# Patient Record
Sex: Female | Born: 1990 | Race: Black or African American | Hispanic: No | Marital: Single | Smoking: Never smoker
Health system: Southern US, Community
[De-identification: ages and names within clinical notes are randomized; demographics above are authoritative.]

## PROBLEM LIST (undated history)

## (undated) ENCOUNTER — Inpatient Hospital Stay (HOSPITAL_COMMUNITY): Payer: Self-pay

## (undated) DIAGNOSIS — R7303 Prediabetes: Secondary | ICD-10-CM

## (undated) DIAGNOSIS — D649 Anemia, unspecified: Secondary | ICD-10-CM

## (undated) DIAGNOSIS — E538 Deficiency of other specified B group vitamins: Secondary | ICD-10-CM

## (undated) DIAGNOSIS — I10 Essential (primary) hypertension: Secondary | ICD-10-CM

## (undated) DIAGNOSIS — R569 Unspecified convulsions: Secondary | ICD-10-CM

## (undated) DIAGNOSIS — R51 Headache: Secondary | ICD-10-CM

## (undated) HISTORY — PX: COSMETIC SURGERY: SHX468

## (undated) HISTORY — PX: WISDOM TOOTH EXTRACTION: SHX21

## (undated) HISTORY — PX: NO PAST SURGERIES: SHX2092

## (undated) HISTORY — DX: Deficiency of other specified B group vitamins: E53.8

## (undated) HISTORY — PX: MOUTH SURGERY: SHX715

## (undated) HISTORY — DX: Anemia, unspecified: D64.9

## (undated) HISTORY — DX: Essential (primary) hypertension: I10

## (undated) HISTORY — DX: Unspecified convulsions: R56.9

## (undated) HISTORY — DX: Prediabetes: R73.03

---
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
## 2010-05-29 ENCOUNTER — Encounter: Admission: RE | Admit: 2010-05-29 | Discharge: 2010-05-29 | Payer: Self-pay | Admitting: Family Medicine

## 2011-03-20 LAB — WET PREP, GENITAL
Clue Cells Wet Prep HPF POC: NONE SEEN
Trich, Wet Prep: NONE SEEN
Yeast Wet Prep HPF POC: NONE SEEN

## 2011-03-20 LAB — URINALYSIS, ROUTINE W REFLEX MICROSCOPIC
Leukocytes, UA: NEGATIVE
Protein, ur: NEGATIVE mg/dL
Specific Gravity, Urine: >1.03 — ABNORMAL HIGH (ref 1.005–1.030)
Urobilinogen, UA: 0.2 mg/dL (ref 0.0–1.0)

## 2011-03-20 LAB — CBC
HCT: 37 % (ref 36.0–46.0)
Hemoglobin: 12.4 g/dL (ref 12.0–15.0)
MCHC: 33.5 g/dL (ref 30.0–36.0)
MCV: 85.3 fL (ref 78.0–100.0)
RBC: 4.34 MIL/uL (ref 3.87–5.11)
RDW: 13.3 % (ref 11.5–15.5)
WBC: 6.4 10*3/uL (ref 4.0–10.5)

## 2011-03-20 LAB — GC/CHLAMYDIA PROBE AMP, GENITAL: Chlamydia, DNA Probe: NEGATIVE

## 2011-03-20 LAB — URINE MICROSCOPIC-ADD ON

## 2011-03-20 LAB — POCT PREGNANCY, URINE: Preg Test, Ur: POSITIVE

## 2011-03-20 LAB — HCG, QUANTITATIVE, PREGNANCY: hCG, Beta Chain, Quant, S: <2 m[IU]/mL (ref <5)

## 2011-08-15 ENCOUNTER — Ambulatory Visit (INDEPENDENT_AMBULATORY_CARE_PROVIDER_SITE_OTHER): Payer: BC Managed Care – PPO

## 2011-08-15 ENCOUNTER — Inpatient Hospital Stay (INDEPENDENT_AMBULATORY_CARE_PROVIDER_SITE_OTHER)
Admission: RE | Admit: 2011-08-15 | Discharge: 2011-08-15 | Disposition: A | Discharge status: Home / Self Care | Payer: BC Managed Care – PPO | Source: Ambulatory Visit | Attending: Emergency Medicine | Admitting: Emergency Medicine

## 2011-08-15 DIAGNOSIS — S61209A Unspecified open wound of unspecified finger without damage to nail, initial encounter: Secondary | ICD-10-CM

## 2011-12-09 NOTE — L&D Delivery Note (Signed)
Delivery Note At 9:32 AM a viable female was delivered via Vaginal, Spontaneous Delivery (Presentation: Left Occiput Anterior).  Loose nuchal cord x 1.  APGAR: 8, 9;  .   Placenta status: Intact, Spontaneous.  Cord: 3 vessels with the following complications: None.    Anesthesia: Epidural Local  Episiotomy: None Lacerations: 2nd degree Suture Repair: 2.0 vicryl rapide Est. Blood Loss (mL):  150 ml  Mom to postpartum.  Baby to nursery-stable.  JACKSON-Pierpoint,Joh Rao A 09/12/2012, 9:59 AM

## 2012-01-28 ENCOUNTER — Encounter (HOSPITAL_COMMUNITY): Payer: Self-pay | Admitting: *Deleted

## 2012-01-28 ENCOUNTER — Inpatient Hospital Stay (HOSPITAL_COMMUNITY)
Admission: AD | Admit: 2012-01-28 | Discharge: 2012-01-28 | Disposition: A | Discharge status: Home Health | Payer: BC Managed Care – PPO | Attending: Obstetrics & Gynecology | Admitting: Obstetrics & Gynecology

## 2012-01-28 ENCOUNTER — Inpatient Hospital Stay (HOSPITAL_COMMUNITY): Payer: BC Managed Care – PPO

## 2012-01-28 DIAGNOSIS — O9989 Other specified diseases and conditions complicating pregnancy, childbirth and the puerperium: Secondary | ICD-10-CM

## 2012-01-28 DIAGNOSIS — O26899 Other specified pregnancy related conditions, unspecified trimester: Secondary | ICD-10-CM

## 2012-01-28 DIAGNOSIS — O99891 Other specified diseases and conditions complicating pregnancy: Secondary | ICD-10-CM | POA: Insufficient documentation

## 2012-01-28 DIAGNOSIS — R109 Unspecified abdominal pain: Secondary | ICD-10-CM | POA: Insufficient documentation

## 2012-01-28 LAB — URINALYSIS, ROUTINE W REFLEX MICROSCOPIC
Bilirubin Urine: NEGATIVE
Glucose, UA: NEGATIVE mg/dL
Leukocytes, UA: NEGATIVE
Nitrite: NEGATIVE
Protein, ur: NEGATIVE mg/dL

## 2012-01-28 LAB — CBC
HCT: 34.4 % — ABNORMAL LOW (ref 36.0–46.0)
MCHC: 33.4 g/dL (ref 30.0–36.0)
RDW: 13.2 % (ref 11.5–15.5)
WBC: 8.6 10*3/uL (ref 4.0–10.5)

## 2012-01-28 NOTE — ED Provider Notes (Signed)
History     Chief Complaint  Patient presents with  . Dysmenorrhea   HPI 21 y.o. G2P0010 at 4.[redacted] weeks EGA, cramping x 1 week, worse tonight, shooting vaginal pain q 30 minutes. No bleeding or discharge. Taking prometrium d/t low progesterone, seeing fertility doctor in [place], conceived via IUI.    Past Medical History  Diagnosis Date  . No pertinent past medical history     Past Surgical History  Procedure Date  . Wisdom tooth extraction     Family History  Problem Relation Age of Onset  . Anesthesia problems Neg Hx     History  Substance Use Topics  . Smoking status: Never Smoker   . Smokeless tobacco: Not on file  . Alcohol Use: No    Allergies: No Known Allergies  Prescriptions prior to admission  Medication Sig Dispense Refill  . acetaminophen (TYLENOL) 500 MG tablet Take 500 mg by mouth every 6 (six) hours as needed. For pain      . Prenatal Vit-Fe Fumarate-FA (PRENATAL MULTIVITAMIN) TABS Take 1 tablet by mouth daily.      . progesterone (PROMETRIUM) 200 MG capsule Take 200 mg by mouth daily.        Review of Systems  Constitutional: Negative.   Respiratory: Negative.   Cardiovascular: Negative.   Gastrointestinal: Positive for abdominal pain. Negative for nausea, vomiting, diarrhea and constipation.  Genitourinary: Negative for dysuria, urgency, frequency, hematuria and flank pain.       Negative for vaginal bleeding  Musculoskeletal: Negative.   Neurological: Negative.   Psychiatric/Behavioral: Negative.    Physical Exam   Blood pressure 132/54, pulse 92, temperature 99.3 F (37.4 C), resp. rate 18, height 5\' 2"  (1.575 m), weight 179 lb (81.194 kg), last menstrual period 12/25/2011, SpO2 100.00%.  Physical Exam  MAU Course  Procedures  Results for orders placed during the hospital encounter of 01/28/12 (from the past 24 hour(s))  URINALYSIS, ROUTINE W REFLEX MICROSCOPIC     Status: Abnormal   Collection Time   01/28/12 12:58 AM   Component Value Range   Color, Urine YELLOW  YELLOW    APPearance CLEAR  CLEAR    Specific Gravity, Urine >1.030 (*) 1.005 - 1.030    pH 6.0  5.0 - 8.0    Glucose, UA NEGATIVE  NEGATIVE (mg/dL)   Hgb urine dipstick NEGATIVE  NEGATIVE    Bilirubin Urine NEGATIVE  NEGATIVE    Ketones, ur NEGATIVE  NEGATIVE (mg/dL)   Protein, ur NEGATIVE  NEGATIVE (mg/dL)   Urobilinogen, UA 0.2  0.0 - 1.0 (mg/dL)   Nitrite NEGATIVE  NEGATIVE    Leukocytes, UA NEGATIVE  NEGATIVE   POCT PREGNANCY, URINE     Status: Abnormal   Collection Time   01/28/12  1:14 AM      Component Value Range   Preg Test, Ur POSITIVE (*) NEGATIVE   CBC     Status: Abnormal   Collection Time   01/28/12  2:20 AM      Component Value Range   WBC 8.6  4.0 - 10.5 (K/uL)   RBC 4.11  3.87 - 5.11 (MIL/uL)   Hemoglobin 11.5 (*) 12.0 - 15.0 (g/dL)   HCT 45.4 (*) 09.8 - 46.0 (%)   MCV 83.7  78.0 - 100.0 (fL)   MCH 28.0  26.0 - 34.0 (pg)   MCHC 33.4  30.0 - 36.0 (g/dL)   RDW 11.9  14.7 - 82.9 (%)   Platelets 224  150 - 400 (  K/uL)   US Ob Comp Less 14 Wks  01/28/2012  *RADIOLOGY REPORT*  Clinical Data: Pelvic pain for 1 week.  OBSTETRIC <14 WK [place] AND TRANSVAGINAL OB US  Technique:  Both transabdominal and transvaginal ultrasound examinations were performed for complete evaluation of the gestation as well as the maternal uterus, adnexal regions, and pelvic cul-de-sac.  Transvaginal technique was performed to assess early pregnancy.  Comparison:  Pelvic ultrasound performed 02/09/2009  Intrauterine gestational sac:  Question of tiny gestational sac, too small to fully define. Yolk sac: No Embryo: No Cardiac Activity: N/A  MSD: 2.0  mm  4    w 4    d         [place] EDC: 10/02/2012  Maternal uterus/adnexae: The uterus is otherwise unremarkable in appearance.  There is no evidence of subchorionic hemorrhage.  The ovaries are within normal limits.  The right ovary measures 2.9 x 1.6 x 1.8 cm, while the left ovary measures 4.1 x 2.5 x 2.1 cm. There is  no evidence for ovarian torsion; no suspicious adnexal masses are seen.  There is no evidence for ectopic pregnancy.  No free fluid is seen within the pelvic cul-de-sac.  IMPRESSION: Suspect tiny intrauterine gestational sac, measuring 2 mm, corresponding to a gestational age of [redacted] weeks 4 days.  This matches the gestational age of [redacted] weeks 6 days by LMP, reflecting an estimated date of delivery of September 30, 2012.  If the patient's symptoms persist, follow-up pelvic ultrasound could be considered in 1 to 2 weeks, for further evaluation.  Original Report Authenticated By: Tonia Ghent, M.D.   US Ob Transvaginal  01/28/2012  *RADIOLOGY REPORT*  Clinical Data: Pelvic pain for 1 week.  OBSTETRIC <14 WK [place] AND TRANSVAGINAL OB US  Technique:  Both transabdominal and transvaginal ultrasound examinations were performed for complete evaluation of the gestation as well as the maternal uterus, adnexal regions, and pelvic cul-de-sac.  Transvaginal technique was performed to assess early pregnancy.  Comparison:  Pelvic ultrasound performed 02/09/2009  Intrauterine gestational sac:  Question of tiny gestational sac, too small to fully define. Yolk sac: No Embryo: No Cardiac Activity: N/A  MSD: 2.0  mm  4    w 4    d         [place] EDC: 10/02/2012  Maternal uterus/adnexae: The uterus is otherwise unremarkable in appearance.  There is no evidence of subchorionic hemorrhage.  The ovaries are within normal limits.  The right ovary measures 2.9 x 1.6 x 1.8 cm, while the left ovary measures 4.1 x 2.5 x 2.1 cm. There is no evidence for ovarian torsion; no suspicious adnexal masses are seen.  There is no evidence for ectopic pregnancy.  No free fluid is seen within the pelvic cul-de-sac.  IMPRESSION: Suspect tiny intrauterine gestational sac, measuring 2 mm, corresponding to a gestational age of [redacted] weeks 4 days.  This matches the gestational age of [redacted] weeks 6 days by LMP, reflecting an estimated date of delivery of September 30, 2012.  If  the patient's symptoms persist, follow-up pelvic ultrasound could be considered in 1 to 2 weeks, for further evaluation.  Original Report Authenticated By: Tonia Ghent, M.D.    Assessment and Plan  21 y.o. G2P0010 at 4.[redacted] weeks EGA Low abd pain F/U 48 hours for repeat quant Precautions rev'd  Jadae Steinke 01/28/2012, 2:21 AM

## 2012-01-28 NOTE — Progress Notes (Signed)
Pt reports "for a week i have been having some serious pains", LMP 12/25/2011, cramping is worsening tonight, denies bleeding. Denies dysuria. Positive home preg test 01/19/2012

## 2012-03-30 ENCOUNTER — Other Ambulatory Visit: Payer: Self-pay | Admitting: Obstetrics

## 2012-03-30 ENCOUNTER — Encounter (HOSPITAL_COMMUNITY): Payer: Self-pay

## 2012-03-30 ENCOUNTER — Inpatient Hospital Stay (HOSPITAL_COMMUNITY)
Admission: AD | Admit: 2012-03-30 | Discharge: 2012-03-30 | Disposition: A | Discharge status: Home / Self Care | Payer: BC Managed Care – PPO | Source: Ambulatory Visit | Attending: Obstetrics | Admitting: Obstetrics

## 2012-03-30 DIAGNOSIS — O219 Vomiting of pregnancy, unspecified: Secondary | ICD-10-CM

## 2012-03-30 DIAGNOSIS — O210 Mild hyperemesis gravidarum: Secondary | ICD-10-CM | POA: Insufficient documentation

## 2012-03-30 DIAGNOSIS — K5900 Constipation, unspecified: Secondary | ICD-10-CM | POA: Insufficient documentation

## 2012-03-30 HISTORY — DX: Headache: R51

## 2012-03-30 MED ORDER — SODIUM CHLORIDE 0.9 % IV SOLN
25.0000 mg | Freq: Once | INTRAVENOUS | Status: DC
  Filled 2012-03-30: qty 1

## 2012-03-30 MED ORDER — LACTATED RINGERS IV SOLN
25.0000 mg | Freq: Once | INTRAVENOUS | Status: AC
  Administered 2012-03-30: 25 mg via INTRAVENOUS
  Filled 2012-03-30: qty 1

## 2012-03-30 MED ORDER — PROMETHAZINE HCL 25 MG PO TABS: 25.0000 mg | ORAL_TABLET | Freq: Four times a day (QID) | ORAL | Status: DC | PRN

## 2012-03-30 MED ORDER — LACTATED RINGERS IV SOLN: 25.0000 mg | Freq: Once | INTRAVENOUS | Status: DC

## 2012-03-30 MED ORDER — BISACODYL 10 MG RE SUPP
10.0000 mg | Freq: Once | RECTAL | Status: AC
  Administered 2012-03-30: 10 mg via RECTAL
  Filled 2012-03-30: qty 1

## 2012-03-30 NOTE — MAU Note (Signed)
Patient states she has been having nausea and vomiting for about 3 weeks, has gotten worse over the past 2 weeks. Feels achy all over for the past few days. Was seen in the office today and sent to MAU for IV fluids.

## 2012-03-30 NOTE — MAU Provider Note (Signed)
  History     CSN: [id number]  Arrival date and time: 03/30/12 4098   First Provider Initiated Contact with Patient 03/30/12 1119      Chief Complaint  Patient presents with  . Emesis During Pregnancy   HPI This is a 21 y.o. female at [redacted]w[redacted]d who presents with c/o vomiting for three weeks, worse today. Denies diarrhea or fever. Has been constipated for 5 days.  No bleeding.   OB History    Grav Para Term Preterm Abortions TAB SAB Ect Mult Living   2    1  1    0      Past Medical History  Diagnosis Date  . Headache     Past Surgical History  Procedure Date  . Wisdom tooth extraction     Family History  Problem Relation Age of Onset  . Anesthesia problems Neg Hx   . Hypertension Mother   . Hypertension Father   . Diabetes Maternal Grandmother   . Diabetes Paternal Grandmother     History  Substance Use Topics  . Smoking status: Never Smoker   . Smokeless tobacco: Not on file  . Alcohol Use: No    Allergies: No Known Allergies  Prescriptions prior to admission  Medication Sig Dispense Refill  . acetaminophen (TYLENOL) 500 MG tablet Take 500 mg by mouth every 6 (six) hours as needed. For pain      . Prenatal Vit-Fe Fumarate-FA (PRENATAL MULTIVITAMIN) TABS Take 1 tablet by mouth daily.      . progesterone (PROMETRIUM) 200 MG capsule Take 200 mg by mouth daily.        Review of Systems  Constitutional: Negative for fever and chills.  Gastrointestinal: Positive for nausea, vomiting and constipation. Negative for abdominal pain and diarrhea.   Physical Exam   Blood pressure 112/69, pulse 95, temperature 98.7 F (37.1 C), temperature source Oral, resp. rate 16, height 5\' 2"  (1.575 m), weight 179 lb 6.4 oz (81.375 kg), last menstrual period 12/25/2011, SpO2 99.00%.  Physical Exam  Constitutional: She is oriented to person, place, and time. She appears well-developed and well-nourished.  Cardiovascular: Normal rate.   Respiratory: Effort normal.  GI: Soft. She  exhibits no distension and no mass. There is no tenderness. There is no rebound and no guarding.  Musculoskeletal: Normal range of motion.  Neurological: She is alert and oriented to person, place, and time.  Skin: Skin is warm and dry.  Psychiatric: She has a normal mood and affect.    MAU Course  Procedures  MDM Will give IVF with Phenergan then second bag of LR.  >>  Feels better after second bag. Finally agreed to have suppository for constipation.   Assessment and Plan  A:  SIUP at [redacted]w[redacted]d       Nausea and vomiting of pregnancy      Constipation P:  Discharge home      Rx phenergan  [facility] 03/30/2012, 11:21 AM

## 2012-03-30 NOTE — Discharge Instructions (Signed)
Morning Sickness Morning sickness is when you feel sick to your stomach (nauseous) during pregnancy. You may feel sick to your stomach and throw up (vomit). You may feel sick in the morning, but you can feel this way any time of day. Some women feel very sick to their stomach and cannot stop throwing up (hyperemesis gravidarum). HOME CARE  Take multivitamins as told by your doctor. Taking multivitamins before getting pregnant can stop or lessen the harshness of morning sickness.   Eat dry toast or unsalted crackers before getting out of bed.   Eat 5 to 6 small meals a day.   Eat dry and bland foods like rice and baked potatoes.   Do not drink liquids with meals. Drink between meals.   Do not eat greasy, fatty, or spicy foods.   Have someone cook for you if the smell of food causes you to feel sick or throw up.   Do not take vitamins with iron, or as told by your doctor.   Eat protein when you need a snack (nuts, yogurt, cheese).   Eat unsweetened gelatins for dessert.   Wear a bracelet used for sea sickness (acupressure wristband).   Go to a doctor that puts thin needles into certain body points (acupuncture) to improve how you feel.   Do not smoke.   Use a humidifier to keep the air in your house free of odors.  GET HELP RIGHT AWAY IF:   You feel very sick to your stomach and cannot stop throwing up.   You pass out (faint).   You have a fever.   You need medicine to feel better.   You feel dizzy or lightheaded.   You are losing weight.   You need help knowing what to eat and what not to eat.  MAKE SURE YOU:   Understand these instructions.   Will watch your condition.   Will get help right away if you are not doing well or get worse.  Document Released: 01/01/2005 Document Revised: 11/13/2011 Document Reviewed: 02/21/2010 Exit[employer] Patient Information 2012 [employer]. 

## 2012-04-07 LAB — OB RESULTS CONSOLE GC/CHLAMYDIA
Chlamydia: NEGATIVE
Gonorrhea: NEGATIVE

## 2012-04-07 LAB — OB RESULTS CONSOLE ANTIBODY SCREEN: Antibody Screen: NEGATIVE

## 2012-04-07 LAB — OB RESULTS CONSOLE HEPATITIS B SURFACE ANTIGEN: Hepatitis B Surface Ag: NEGATIVE

## 2012-04-07 LAB — OB RESULTS CONSOLE RPR: RPR: NONREACTIVE

## 2012-04-07 LAB — OB RESULTS CONSOLE RUBELLA ANTIBODY, IGM: Rubella: IMMUNE

## 2012-04-07 MED ORDER — LACTATED RINGERS IV SOLN: Freq: Once | INTRAVENOUS | Status: DC

## 2012-04-07 MED ORDER — SODIUM CHLORIDE 0.9 % IV SOLN: 8.0000 mg | Freq: Once | INTRAVENOUS | Status: DC

## 2012-04-07 MED ORDER — LACTATED RINGERS IV SOLN: INTRAVENOUS | Status: DC

## 2012-04-25 ENCOUNTER — Encounter (HOSPITAL_COMMUNITY): Payer: Self-pay | Admitting: Obstetrics and Gynecology

## 2012-04-25 ENCOUNTER — Inpatient Hospital Stay (HOSPITAL_COMMUNITY)
Admission: AD | Admit: 2012-04-25 | Discharge: 2012-04-25 | Disposition: A | Discharge status: Home / Self Care | Payer: BC Managed Care – PPO | Source: Ambulatory Visit | Attending: Obstetrics | Admitting: Obstetrics

## 2012-04-25 DIAGNOSIS — J069 Acute upper respiratory infection, unspecified: Secondary | ICD-10-CM | POA: Insufficient documentation

## 2012-04-25 DIAGNOSIS — O99891 Other specified diseases and conditions complicating pregnancy: Secondary | ICD-10-CM | POA: Insufficient documentation

## 2012-04-25 DIAGNOSIS — Z331 Pregnant state, incidental: Secondary | ICD-10-CM

## 2012-04-25 LAB — URINE MICROSCOPIC-ADD ON

## 2012-04-25 LAB — URINALYSIS, ROUTINE W REFLEX MICROSCOPIC
Bilirubin Urine: NEGATIVE
Hgb urine dipstick: NEGATIVE
Ketones, ur: NEGATIVE mg/dL
Nitrite: NEGATIVE
Urobilinogen, UA: 0.2 mg/dL (ref 0.0–1.0)

## 2012-04-25 MED ORDER — ONDANSETRON 8 MG PO TBDP: 8.0000 mg | ORAL_TABLET | Freq: Once | ORAL | Status: AC

## 2012-04-25 MED ORDER — BENZONATATE 100 MG PO CAPS: 200.0000 mg | ORAL_CAPSULE | Freq: Two times a day (BID) | ORAL | Status: AC | PRN

## 2012-04-25 MED ORDER — GUAIFENESIN ER 600 MG PO TB12: 1200.0000 mg | ORAL_TABLET | Freq: Two times a day (BID) | ORAL | Status: DC

## 2012-04-25 MED ORDER — CETIRIZINE HCL 10 MG PO TABS: 10.0000 mg | ORAL_TABLET | Freq: Every day | ORAL | Status: DC

## 2012-04-25 MED ORDER — ONDANSETRON 8 MG PO TBDP
8.0000 mg | ORAL_TABLET | Freq: Once | ORAL | Status: AC
  Administered 2012-04-25: 8 mg via ORAL
  Filled 2012-04-25: qty 1

## 2012-04-25 NOTE — Discharge Instructions (Signed)
Cough, Adult   A cough is a reflex. It helps you clear your throat and airways. A cough can help heal your body. A cough can last 2 or 3 weeks (acute) or may last more than 8 weeks (chronic). Some common causes of a cough can include an infection, allergy, or a cold.  HOME CARE   Only take medicine as told by your doctor.   If given, take your medicines (antibiotics) as told. Finish them even if you start to feel better.   Use a cold steam vaporizer or humidier in your home. This can help loosen thick spit (secretions).   Sleep so you are almost sitting up (semi-upright). Use pillows to do this. This helps reduce coughing.   Rest as needed.   Stop smoking if you smoke.  GET HELP RIGHT AWAY IF:   You have yellowish-white fluid (pus) in your thick spit.   Your cough gets worse.   Your medicine does not reduce coughing, and you are losing sleep.   You cough up blood.   You have trouble breathing.   Your pain gets worse and medicine does not help.   You have a fever.  MAKE SURE YOU:     Understand these instructions.   Will watch your condition.   Will get help right away if you are not doing well or get worse.  Document Released: 08/07/2011 Document Revised: 11/13/2011 Document Reviewed: 08/07/2011  ExitCare Patient Information 2012 [employer].    Upper Respiratory Infection, Adult  An upper respiratory infection (URI) is also known as the common cold. It is often caused by a type of germ (virus). Colds are easily spread (contagious). You can pass it to others by kissing, coughing, sneezing, or drinking out of the same glass. Usually, you get better in 1 or 2 weeks.    HOME CARE     Only take medicine as told by your doctor.   Use a warm mist humidifier or breathe in steam from a hot shower.   Drink enough water and fluids to keep your pee (urine) clear or pale yellow.   Get plenty of rest.    Return to work when your temperature is back to normal or as told by your doctor. You may use a face mask and wash your hands to stop your cold from spreading.  GET HELP RIGHT AWAY IF:     After the first few days, you feel you are getting worse.   You have questions about your medicine.   You have chills, shortness of breath, or brown or red spit (mucus).   You have yellow or brown snot (nasal discharge) or pain in the face, especially when you bend forward.   You have a fever, puffy (swollen) neck, pain when you swallow, or white spots in the back of your throat.   You have a bad headache, ear pain, sinus pain, or chest pain.   You have a high-pitched whistling sound when you breathe in and out (wheezing).   You have a lasting cough or cough up blood.   You have sore muscles or a stiff neck.  MAKE SURE YOU:     Understand these instructions.   Will watch your condition.   Will get help right away if you are not doing well or get worse.  Document Released: 05/12/2008 Document Revised: 11/13/2011 Document Reviewed: 03/31/2011  ExitCare Patient Information 2012 [employer].

## 2012-04-25 NOTE — MAU Note (Signed)
"  My throat hurts, my chest hurts, my lower bad is cramping.  I threw up 3 times this morning and I'm thinking that helped some of the cold come up, because my coughing has gotten better.  I haven't been able to sleep for the past 2 nights, because of coughing so much.  My stomach is feeling real weird."

## 2012-04-25 NOTE — MAU Note (Signed)
Last four days having cold like symptoms increased last two days, has not slept at all for last two nights, cough, throat hurting vomiting [redacted]w[redacted]d

## 2012-08-26 ENCOUNTER — Other Ambulatory Visit: Payer: Self-pay | Admitting: Obstetrics & Gynecology

## 2012-08-26 DIAGNOSIS — O0990 Supervision of high risk pregnancy, unspecified, unspecified trimester: Secondary | ICD-10-CM

## 2012-08-27 ENCOUNTER — Ambulatory Visit (HOSPITAL_COMMUNITY)
Admission: RE | Admit: 2012-08-27 | Discharge: 2012-08-27 | Disposition: A | Discharge status: Home / Self Care | Payer: BC Managed Care – PPO | Source: Ambulatory Visit | Attending: Obstetrics & Gynecology | Admitting: Obstetrics & Gynecology

## 2012-08-27 DIAGNOSIS — O0990 Supervision of high risk pregnancy, unspecified, unspecified trimester: Secondary | ICD-10-CM

## 2012-08-27 DIAGNOSIS — O365990 Maternal care for other known or suspected poor fetal growth, unspecified trimester, not applicable or unspecified: Secondary | ICD-10-CM | POA: Insufficient documentation

## 2012-09-01 ENCOUNTER — Encounter (HOSPITAL_COMMUNITY): Payer: Self-pay | Admitting: *Deleted

## 2012-09-01 ENCOUNTER — Other Ambulatory Visit: Payer: Self-pay | Admitting: Obstetrics & Gynecology

## 2012-09-01 ENCOUNTER — Telehealth (HOSPITAL_COMMUNITY): Payer: Self-pay | Admitting: *Deleted

## 2012-09-01 DIAGNOSIS — IMO0002 Reserved for concepts with insufficient information to code with codable children: Secondary | ICD-10-CM

## 2012-09-01 NOTE — Telephone Encounter (Signed)
Preadmission screen  

## 2012-09-03 ENCOUNTER — Ambulatory Visit (HOSPITAL_COMMUNITY)
Admission: RE | Admit: 2012-09-03 | Discharge: 2012-09-03 | Disposition: A | Discharge status: Home / Self Care | Payer: Medicaid Other | Source: Ambulatory Visit | Attending: Obstetrics & Gynecology | Admitting: Obstetrics & Gynecology

## 2012-09-03 DIAGNOSIS — O365990 Maternal care for other known or suspected poor fetal growth, unspecified trimester, not applicable or unspecified: Secondary | ICD-10-CM | POA: Insufficient documentation

## 2012-09-03 DIAGNOSIS — Z3689 Encounter for other specified antenatal screening: Secondary | ICD-10-CM | POA: Insufficient documentation

## 2012-09-03 DIAGNOSIS — IMO0002 Reserved for concepts with insufficient information to code with codable children: Secondary | ICD-10-CM

## 2012-09-06 ENCOUNTER — Other Ambulatory Visit: Payer: Self-pay | Admitting: Obstetrics & Gynecology

## 2012-09-10 ENCOUNTER — Other Ambulatory Visit (HOSPITAL_COMMUNITY): Payer: Self-pay | Admitting: Diagnostic Radiology

## 2012-09-10 ENCOUNTER — Ambulatory Visit (HOSPITAL_COMMUNITY)
Admission: RE | Admit: 2012-09-10 | Discharge: 2012-09-10 | Disposition: A | Discharge status: Home / Self Care | Payer: BC Managed Care – PPO | Source: Ambulatory Visit | Attending: Obstetrics & Gynecology | Admitting: Obstetrics & Gynecology

## 2012-09-10 DIAGNOSIS — Z3689 Encounter for other specified antenatal screening: Secondary | ICD-10-CM | POA: Insufficient documentation

## 2012-09-10 DIAGNOSIS — IMO0002 Reserved for concepts with insufficient information to code with codable children: Secondary | ICD-10-CM

## 2012-09-10 DIAGNOSIS — O365990 Maternal care for other known or suspected poor fetal growth, unspecified trimester, not applicable or unspecified: Secondary | ICD-10-CM | POA: Insufficient documentation

## 2012-09-11 ENCOUNTER — Encounter (HOSPITAL_COMMUNITY): Payer: Self-pay | Admitting: Anesthesiology

## 2012-09-11 ENCOUNTER — Encounter (HOSPITAL_COMMUNITY): Payer: Self-pay

## 2012-09-11 ENCOUNTER — Inpatient Hospital Stay (HOSPITAL_COMMUNITY)
Admission: RE | Admit: 2012-09-11 | Discharge: 2012-09-14 | DRG: 373 | Disposition: A | Discharge status: Home / Self Care | Payer: BC Managed Care – PPO | Source: Ambulatory Visit | Attending: Obstetrics & Gynecology | Admitting: Obstetrics & Gynecology

## 2012-09-11 DIAGNOSIS — O99892 Other specified diseases and conditions complicating childbirth: Secondary | ICD-10-CM | POA: Diagnosis present

## 2012-09-11 DIAGNOSIS — Z22330 Carrier of Group B streptococcus: Secondary | ICD-10-CM

## 2012-09-11 DIAGNOSIS — D649 Anemia, unspecified: Secondary | ICD-10-CM | POA: Diagnosis present

## 2012-09-11 DIAGNOSIS — O365990 Maternal care for other known or suspected poor fetal growth, unspecified trimester, not applicable or unspecified: Principal | ICD-10-CM | POA: Diagnosis present

## 2012-09-11 DIAGNOSIS — O9902 Anemia complicating childbirth: Secondary | ICD-10-CM | POA: Diagnosis present

## 2012-09-11 DIAGNOSIS — IMO0002 Reserved for concepts with insufficient information to code with codable children: Secondary | ICD-10-CM | POA: Diagnosis present

## 2012-09-11 LAB — CBC
HCT: 29.9 % — ABNORMAL LOW (ref 36.0–46.0)
Hemoglobin: 10.2 g/dL — ABNORMAL LOW (ref 12.0–15.0)
MCH: 28 pg (ref 26.0–34.0)
MCHC: 34.1 g/dL (ref 30.0–36.0)

## 2012-09-11 MED ORDER — OXYTOCIN BOLUS FROM INFUSION
500.0000 mL | Freq: Once | INTRAVENOUS | Status: DC
  Filled 2012-09-11: qty 500

## 2012-09-11 MED ORDER — OXYCODONE-ACETAMINOPHEN 5-325 MG PO TABS: 1.0000 | ORAL_TABLET | ORAL | Status: DC | PRN

## 2012-09-11 MED ORDER — CITRIC ACID-SODIUM CITRATE 334-500 MG/5ML PO SOLN: 30.0000 mL | ORAL | Status: DC | PRN

## 2012-09-11 MED ORDER — PENICILLIN G POTASSIUM 5000000 UNITS IJ SOLR
5.0000 10*6.[IU] | Freq: Once | INTRAMUSCULAR | Status: AC
  Administered 2012-09-11: 5 10*6.[IU] via INTRAVENOUS
  Filled 2012-09-11: qty 5

## 2012-09-11 MED ORDER — MISOPROSTOL 25 MCG QUARTER TABLET
25.0000 ug | ORAL_TABLET | ORAL | Status: DC | PRN
  Administered 2012-09-11 (×3): 25 ug via VAGINAL
  Filled 2012-09-11: qty 1
  Filled 2012-09-11 (×3): qty 0.25

## 2012-09-11 MED ORDER — BUTORPHANOL TARTRATE 1 MG/ML IJ SOLN
1.0000 mg | INTRAMUSCULAR | Status: DC | PRN
  Administered 2012-09-12 (×2): 1 mg via INTRAVENOUS
  Filled 2012-09-11 (×2): qty 1

## 2012-09-11 MED ORDER — ONDANSETRON HCL 4 MG/2ML IJ SOLN: 4.0000 mg | Freq: Four times a day (QID) | INTRAMUSCULAR | Status: DC | PRN

## 2012-09-11 MED ORDER — PENICILLIN G POTASSIUM 5000000 UNITS IJ SOLR
2.5000 10*6.[IU] | INTRAVENOUS | Status: DC
  Administered 2012-09-11 – 2012-09-12 (×4): 2.5 10*6.[IU] via INTRAVENOUS
  Filled 2012-09-11 (×6): qty 2.5

## 2012-09-11 MED ORDER — HYDROXYZINE HCL 50 MG/ML IM SOLN
50.0000 mg | Freq: Four times a day (QID) | INTRAMUSCULAR | Status: DC | PRN
  Filled 2012-09-11: qty 1

## 2012-09-11 MED ORDER — TERBUTALINE SULFATE 1 MG/ML IJ SOLN: 0.2500 mg | Freq: Once | INTRAMUSCULAR | Status: AC | PRN

## 2012-09-11 MED ORDER — OXYTOCIN 40 UNITS IN LACTATED RINGERS INFUSION - SIMPLE MED: 62.5000 mL/h | Freq: Once | INTRAVENOUS | Status: DC

## 2012-09-11 MED ORDER — ACETAMINOPHEN 325 MG PO TABS: 650.0000 mg | ORAL_TABLET | ORAL | Status: DC | PRN

## 2012-09-11 MED ORDER — IBUPROFEN 600 MG PO TABS: 600.0000 mg | ORAL_TABLET | Freq: Four times a day (QID) | ORAL | Status: DC | PRN

## 2012-09-11 MED ORDER — LACTATED RINGERS IV SOLN
INTRAVENOUS | Status: DC
  Administered 2012-09-11 – 2012-09-12 (×2): via INTRAVENOUS

## 2012-09-11 MED ORDER — HYDROXYZINE HCL 50 MG PO TABS
50.0000 mg | ORAL_TABLET | Freq: Four times a day (QID) | ORAL | Status: DC | PRN
  Filled 2012-09-11: qty 1

## 2012-09-11 MED ORDER — LACTATED RINGERS IV SOLN: 500.0000 mL | INTRAVENOUS | Status: DC | PRN

## 2012-09-11 MED ORDER — LIDOCAINE HCL (PF) 1 % IJ SOLN
30.0000 mL | INTRAMUSCULAR | Status: AC | PRN
  Administered 2012-09-12: 30 mL via SUBCUTANEOUS
  Filled 2012-09-11: qty 30

## 2012-09-11 NOTE — H&P (Signed)
Tamara May is a 21 y.o. female presenting for IOL. Maternal Medical History:  Reason for admission: Reason for Admission:   nauseaFor IOL secondary to IUGR.  An U/S for growth several weeks ago showed an EFW at <10%.  [facility] was at <10%.  Antenatal testing has been reassuring including U/A doppler studies.   Fetal activity: Perceived fetal activity is normal.    Prenatal complications: IUGR.     OB History    Grav Para Term Preterm Abortions TAB SAB Ect Mult Living   3    2 1 1    0     Past Medical History  Diagnosis Date  . Headache   . Seizures     as a child   Past Surgical History  Procedure Date  . Wisdom tooth extraction   . Mouth surgery    Family History: family history includes Alcohol abuse in her paternal grandmother; Asthma in her mother; Cancer in her mother; Diabetes in her maternal grandmother and paternal grandmother; Gout in her father; Hypertension in her father and mother; Stroke in her maternal grandmother; and Vision loss in her maternal grandmother and maternal uncle.  There is no history of Anesthesia problems. Social History:  reports that she has never smoked. She does not have any smokeless tobacco history on file. She reports that she does not drink alcohol or use illicit drugs.     Review of Systems  Constitutional: Negative for fever.  Eyes: Negative for blurred vision.  Respiratory: Negative for shortness of breath.   Gastrointestinal: Negative for nausea and vomiting.  Skin: Negative for rash.  Neurological: Negative for headaches.    Dilation: 1 Effacement (%): 50 Station: -3 Exam by:: B.Smith RN Blood pressure 125/76, pulse 79, temperature 97.8 F (36.6 C), temperature source Oral, resp. rate 20, height 5\' 2"  (1.575 m), weight 100.699 kg (222 lb), last menstrual period 12/25/2011. Maternal Exam:  Abdomen: Fetal presentation: vertex  Introitus: not evaluated.   Cervix: Cervix evaluated by digital exam.     Fetal Exam Fetal  Monitor Review: Variability: moderate (6-25 bpm).   Pattern: accelerations present and no decelerations.    Fetal State Assessment: Category I - tracings are normal.     Physical Exam  Constitutional: She appears well-developed.  HENT:  Head: Normocephalic.  Neck: Neck supple. No thyromegaly present.  Cardiovascular: Normal rate and regular rhythm.   Respiratory: Breath sounds normal.  GI: Soft. Bowel sounds are normal.  Skin: No rash noted.    Prenatal labs: ABO, Rh: B/Positive/-- (05/01 0000) Antibody: Negative (05/01 0000) Rubella: Immune (05/01 0000) RPR: NON REACTIVE (10/05 0940)  HBsAg: Negative (05/01 0000)  HIV: Non-reactive (05/01 0000)  GBS: Positive (09/23 0000)   Assessment/Plan: [place] @ [redacted]w[redacted]d.  Pregnancy complicated by IUGR <105; AC <10%.  Antenatal testing reassuring.  Unfavorable Bishop's score; GBS pos  Admit Two-stage IOL PCN for GBS prophylaxis   JACKSON-Kalisz,Azzie Thiem A 09/11/2012, 5:52 PM

## 2012-09-12 ENCOUNTER — Inpatient Hospital Stay (HOSPITAL_COMMUNITY): Payer: BC Managed Care – PPO | Admitting: Anesthesiology

## 2012-09-12 ENCOUNTER — Encounter (HOSPITAL_COMMUNITY): Payer: Self-pay | Admitting: Anesthesiology

## 2012-09-12 ENCOUNTER — Encounter (HOSPITAL_COMMUNITY): Payer: Self-pay

## 2012-09-12 MED ORDER — FENTANYL 2.5 MCG/ML BUPIVACAINE 1/10 % EPIDURAL INFUSION (WH - ANES)
14.0000 mL/h | INTRAMUSCULAR | Status: DC
  Filled 2012-09-12: qty 123

## 2012-09-12 MED ORDER — TERBUTALINE SULFATE 1 MG/ML IJ SOLN: 0.2500 mg | Freq: Once | INTRAMUSCULAR | Status: DC | PRN

## 2012-09-12 MED ORDER — PHENYLEPHRINE 40 MCG/ML (10ML) SYRINGE FOR IV PUSH (FOR BLOOD PRESSURE SUPPORT): 80.0000 ug | PREFILLED_SYRINGE | INTRAVENOUS | Status: DC | PRN

## 2012-09-12 MED ORDER — ONDANSETRON HCL 4 MG/2ML IJ SOLN: 4.0000 mg | INTRAMUSCULAR | Status: DC | PRN

## 2012-09-12 MED ORDER — OXYCODONE-ACETAMINOPHEN 5-325 MG PO TABS
1.0000 | ORAL_TABLET | ORAL | Status: DC | PRN
  Administered 2012-09-12: 2 via ORAL
  Administered 2012-09-12 – 2012-09-14 (×3): 1 via ORAL
  Filled 2012-09-12: qty 2
  Filled 2012-09-12 (×2): qty 1

## 2012-09-12 MED ORDER — WITCH HAZEL-GLYCERIN EX PADS: 1.0000 "application " | MEDICATED_PAD | CUTANEOUS | Status: DC | PRN

## 2012-09-12 MED ORDER — ZOLPIDEM TARTRATE 5 MG PO TABS: 5.0000 mg | ORAL_TABLET | Freq: Every evening | ORAL | Status: DC | PRN

## 2012-09-12 MED ORDER — ONDANSETRON HCL 4 MG PO TABS: 4.0000 mg | ORAL_TABLET | ORAL | Status: DC | PRN

## 2012-09-12 MED ORDER — LACTATED RINGERS IV SOLN: 500.0000 mL | Freq: Once | INTRAVENOUS | Status: DC

## 2012-09-12 MED ORDER — PHENYLEPHRINE 40 MCG/ML (10ML) SYRINGE FOR IV PUSH (FOR BLOOD PRESSURE SUPPORT)
80.0000 ug | PREFILLED_SYRINGE | INTRAVENOUS | Status: DC | PRN
  Filled 2012-09-12: qty 5

## 2012-09-12 MED ORDER — MAGNESIUM HYDROXIDE 400 MG/5ML PO SUSP: 30.0000 mL | ORAL | Status: DC | PRN

## 2012-09-12 MED ORDER — EPHEDRINE 5 MG/ML INJ
10.0000 mg | INTRAVENOUS | Status: DC | PRN
  Filled 2012-09-12: qty 4

## 2012-09-12 MED ORDER — PRENATAL MULTIVITAMIN CH
1.0000 | ORAL_TABLET | Freq: Every day | ORAL | Status: DC
  Administered 2012-09-12 – 2012-09-14 (×3): 1 via ORAL
  Filled 2012-09-12 (×3): qty 1

## 2012-09-12 MED ORDER — FERROUS SULFATE 325 (65 FE) MG PO TABS
325.0000 mg | ORAL_TABLET | Freq: Two times a day (BID) | ORAL | Status: DC
  Administered 2012-09-12 – 2012-09-14 (×4): 325 mg via ORAL
  Filled 2012-09-12 (×4): qty 1

## 2012-09-12 MED ORDER — IBUPROFEN 600 MG PO TABS
600.0000 mg | ORAL_TABLET | Freq: Four times a day (QID) | ORAL | Status: DC
  Administered 2012-09-12 – 2012-09-14 (×9): 600 mg via ORAL
  Filled 2012-09-12 (×9): qty 1

## 2012-09-12 MED ORDER — DIBUCAINE 1 % RE OINT: 1.0000 "application " | TOPICAL_OINTMENT | RECTAL | Status: DC | PRN

## 2012-09-12 MED ORDER — SENNOSIDES-DOCUSATE SODIUM 8.6-50 MG PO TABS
2.0000 | ORAL_TABLET | Freq: Every day | ORAL | Status: DC
  Administered 2012-09-12 – 2012-09-13 (×2): 2 via ORAL

## 2012-09-12 MED ORDER — BENZOCAINE-MENTHOL 20-0.5 % EX AERO
1.0000 "application " | INHALATION_SPRAY | CUTANEOUS | Status: DC | PRN
  Administered 2012-09-12: 1 via TOPICAL
  Filled 2012-09-12: qty 56

## 2012-09-12 MED ORDER — DIPHENHYDRAMINE HCL 25 MG PO CAPS: 25.0000 mg | ORAL_CAPSULE | Freq: Four times a day (QID) | ORAL | Status: DC | PRN

## 2012-09-12 MED ORDER — LIDOCAINE HCL (PF) 1 % IJ SOLN
INTRAMUSCULAR | Status: DC | PRN
  Administered 2012-09-12 (×2): 9 mL

## 2012-09-12 MED ORDER — DIPHENHYDRAMINE HCL 50 MG/ML IJ SOLN: 12.5000 mg | INTRAMUSCULAR | Status: DC | PRN

## 2012-09-12 MED ORDER — EPHEDRINE 5 MG/ML INJ: 10.0000 mg | INTRAVENOUS | Status: DC | PRN

## 2012-09-12 MED ORDER — OXYTOCIN 40 UNITS IN LACTATED RINGERS INFUSION - SIMPLE MED
1.0000 m[IU]/min | INTRAVENOUS | Status: DC
  Administered 2012-09-12: 2 m[IU]/min via INTRAVENOUS
  Filled 2012-09-12: qty 1000

## 2012-09-12 MED ORDER — LANOLIN HYDROUS EX OINT: TOPICAL_OINTMENT | CUTANEOUS | Status: DC | PRN

## 2012-09-12 MED ORDER — TETANUS-DIPHTH-ACELL PERTUSSIS 5-2.5-18.5 LF-MCG/0.5 IM SUSP: 0.5000 mL | Freq: Once | INTRAMUSCULAR | Status: DC

## 2012-09-12 MED ORDER — MEASLES, MUMPS & RUBELLA VAC ~~LOC~~ INJ
0.5000 mL | INJECTION | Freq: Once | SUBCUTANEOUS | Status: DC
  Filled 2012-09-12: qty 0.5

## 2012-09-12 MED ORDER — FENTANYL 2.5 MCG/ML BUPIVACAINE 1/10 % EPIDURAL INFUSION (WH - ANES)
INTRAMUSCULAR | Status: DC | PRN
  Administered 2012-09-12: 14 mL/h via EPIDURAL

## 2012-09-12 NOTE — Anesthesia Procedure Notes (Signed)
Epidural Patient location during procedure: OB Start time: 09/12/2012 7:20 AM End time: 09/12/2012 7:26 AM  Staffing Anesthesiologist: Sandrea Hughs Performed by: anesthesiologist   Preanesthetic Checklist Completed: patient identified, site marked, surgical consent, pre-op evaluation, timeout performed, IV checked, risks and benefits discussed and monitors and equipment checked  Epidural Patient position: sitting Prep: site prepped and draped and DuraPrep Patient monitoring: continuous pulse ox and blood pressure Approach: midline Injection technique: LOR air  Needle:  Needle type: Tuohy  Needle gauge: 17 G Needle length: 9 cm and 9 Needle insertion depth: 8 cm Catheter type: closed end flexible Catheter size: 19 Gauge Catheter at skin depth: 13 cm Test dose: negative and Other  Assessment Sensory level: T8 Events: blood not aspirated, injection not painful, no injection resistance, negative IV test and no paresthesia  Additional Notes Reason for block:procedure for pain

## 2012-09-12 NOTE — Anesthesia Preprocedure Evaluation (Signed)
Anesthesia Evaluation  Patient identified by MRN, date of birth, ID band Patient awake    Reviewed: Allergy & Precautions, H&P , NPO status , Patient's Chart, lab work & pertinent test results  Airway Mallampati: II TM Distance: >3 FB Neck ROM: full    Dental No notable dental hx.    Pulmonary neg pulmonary ROS,    Pulmonary exam normal       Cardiovascular negative cardio ROS      Neuro/Psych negative psych ROS   GI/Hepatic negative GI ROS, Neg liver ROS,   Endo/Other  Morbid obesity  Renal/GU negative Renal ROS  negative genitourinary   Musculoskeletal negative musculoskeletal ROS (+)   Abdominal (+) + obese,   Peds negative pediatric ROS (+)  Hematology negative hematology ROS (+)   Anesthesia Other Findings   Reproductive/Obstetrics (+) Pregnancy                           Anesthesia Physical Anesthesia Plan  ASA: III  Anesthesia Plan: Epidural   Post-op Pain Management:    Induction:   Airway Management Planned:   Additional Equipment:   Intra-op Plan:   Post-operative Plan:   Informed Consent: I have reviewed the patients History and Physical, chart, labs and discussed the procedure including the risks, benefits and alternatives for the proposed anesthesia with the patient or authorized representative who has indicated his/her understanding and acceptance.     Plan Discussed with:   Anesthesia Plan Comments:         Anesthesia Quick Evaluation  

## 2012-09-13 LAB — CBC
Hemoglobin: 8.7 g/dL — ABNORMAL LOW (ref 12.0–15.0)
MCH: 27.1 pg (ref 26.0–34.0)
MCHC: 32.7 g/dL (ref 30.0–36.0)
RDW: 14.4 % (ref 11.5–15.5)

## 2012-09-13 NOTE — Progress Notes (Signed)
Post Partum Day 1 Subjective: no complaints  Objective: Blood pressure 115/80, pulse 83, temperature 98.1 F (36.7 C), temperature source Oral, resp. rate 18, height 5\' 2"  (1.575 m), weight 100.699 kg (222 lb), last menstrual period 12/25/2011, SpO2 98.00%, unknown if currently breastfeeding.  Physical Exam:  General: alert and no distress Lochia: appropriate Uterine Fundus: firm Incision: healing well DVT Evaluation: No evidence of DVT seen on physical exam.   Basename 09/13/12 0525 09/11/12 0940  HGB 8.7* 10.2*  HCT 26.6* 29.9*    Assessment/Plan: Plan for discharge tomorrow.  Anemia.  Chronic.  Clinically stable.   LOS: 2 days   [place] Kievit A 09/13/2012, 6:30 AM

## 2012-09-13 NOTE — Anesthesia Postprocedure Evaluation (Deleted)
  Anesthesia Post-op Note  Patient: Tamara May  Procedure(s) Performed: * No procedures listed *  Patient Location: Mother/Baby  Anesthesia Type: Epidural  Level of Consciousness: awake, alert  and oriented  Airway and Oxygen Therapy: Patient Spontanous Breathing  Post-op Pain: none  Post-op Assessment: Post-op Vital signs reviewed and Patient's Cardiovascular Status Stable  Post-op Vital Signs: Reviewed and stable  Complications: No apparent anesthesia complications

## 2012-09-14 MED ORDER — IBUPROFEN 600 MG PO TABS: 600.0000 mg | ORAL_TABLET | Freq: Four times a day (QID) | ORAL | Status: DC

## 2012-09-14 MED ORDER — OXYCODONE-ACETAMINOPHEN 5-325 MG PO TABS: 1.0000 | ORAL_TABLET | ORAL | Status: DC | PRN

## 2012-09-14 NOTE — Progress Notes (Signed)
Post Partum Day 2 Subjective: no complaints  Objective: Blood pressure 134/85, pulse 84, temperature 98.3 F (36.8 C), temperature source Oral, resp. rate 20, height 5\' 2"  (1.575 m), weight 100.699 kg (222 lb), last menstrual period 12/25/2011, SpO2 98.00%, unknown if currently breastfeeding.  Physical Exam:  General: alert and no distress Lochia: appropriate Uterine Fundus: firm Incision: healing well DVT Evaluation: No evidence of DVT seen on physical exam.   Basename 09/13/12 0525 09/11/12 0940  HGB 8.7* 10.2*  HCT 26.6* 29.9*    Assessment/Plan: Discharge home   LOS: 3 days   HARPER,CHARLES A 09/14/2012, 6:09 AM

## 2012-09-14 NOTE — Anesthesia Postprocedure Evaluation (Signed)
Anesthesia Post Note  Patient: Tamara May  Procedure(s) Performed: * No procedures listed *  Anesthesia type: Epidural  Patient location: Mother/Baby  Post pain: Pain level controlled  Post assessment: Post-op Vital signs reviewed  Last Vitals:  Filed Vitals:   09/14/12 0604  BP: 134/85  Pulse: 84  Temp:   Resp:     Post vital signs: Reviewed  Level of consciousness: awake  Complications: No apparent anesthesia complications

## 2012-09-14 NOTE — Discharge Summary (Signed)
Obstetric Discharge Summary Reason for Admission: onset of labor Prenatal Procedures: ultrasound Intrapartum Procedures: spontaneous vaginal delivery Postpartum Procedures: none Complications-Operative and Postpartum: none Hemoglobin  Date Value Range Status  09/13/2012 8.7* 12.0 - 15.0 g/dL Final     HCT  Date Value Range Status  09/13/2012 26.6* 36.0 - 46.0 % Final    Physical Exam:  General: alert and no distress Lochia: appropriate Uterine Fundus: firm Incision: healing well DVT Evaluation: No evidence of DVT seen on physical exam.  Discharge Diagnoses: Term Pregnancy-delivered  Discharge Information: Date: 09/14/2012 Activity: pelvic rest Diet: routine Medications: PNV, Ibuprofen, Colace and Percocet Condition: stable Instructions: refer to practice specific booklet Discharge to: home Follow-up Information    Follow up with Yotam Rhine A, MD. Schedule an appointment as soon as possible for a visit in 6 weeks. (Medicaid list calling)    Contact information:   [address] [place] [address] [phone]          Newborn Data: Live born female  Birth Weight: 5 lb 1.7 oz (2315 g) APGAR: 8, 9  Home with mother.  Brace Welte A 09/14/2012, 6:19 AM

## 2012-09-14 NOTE — Addendum Note (Signed)
Addendum  created 09/14/12 1006 by Sandrea Hughs., MD   Modules edited:Notes Section

## 2012-09-16 ENCOUNTER — Ambulatory Visit (HOSPITAL_COMMUNITY)
Admission: RE | Admit: 2012-09-16 | Discharge: 2012-09-16 | Disposition: A | Discharge status: Home / Self Care | Payer: BC Managed Care – PPO | Source: Ambulatory Visit | Attending: Obstetrics & Gynecology | Admitting: Obstetrics & Gynecology

## 2012-09-16 NOTE — Progress Notes (Addendum)
Adult Lactation Consultation Outpatient Visit Note  Patient Name: Tamara May Date of Birth: 1991-01-09 Gestational Age at Delivery: [redacted]w[redacted]d Type of Delivery: 09/12/2012 for Baby Boy [place] - Late Preterm - 37 week infant                               Today is 46 days old with jaundice                                                  Birth weight- 5-1.7 oz                                                                                                                                      D/C weight- 4-14 oz ( 4-5% weight loss)                                                                                                                                      47 hour bili check= 9.4                                                                                                                                      Per mom weight at Dr.E Little- [facility] -5-1.5 oz today  Per Mom tomorrow - @[facility]  [facility] Bili and weight check   Breastfeeding History:- In the hospital latching with a nipple shield and bottle feeding formula and expressed milk Per mom since D/C on Tuesday not using the nipple shield and feeding EBM in a bottle ( per mom 30 -45 ml ) , tol well. Milk came in last evening and have had challenges with engorgement and lumps,  Frequency of Breastfeeding: Attempts at home average 3-5 mins and few sucks  Length of Feeding:  Voids:4 or greater   Stools: >5 greenish loose to yellow   Supplementing / Method: ( see note above ) Pumping:  Type of Pump:DEBP -[facility] ( obtained Tuesday) ,    Frequency: Per mom every 3-4 hours ,sometimes longer at night   Volume:  @least  30 ml , Average 4 oz from each breast   Comments: Per mom using a 27 flange but it feels tight,                     @consult  increased to #30 and per mom felt better     Reminded mom once the swelling decreases to decrease back down to #27 and if comfortable stay at the #27.     Consultation Evaluation: breast bilaterally engorged ( right >left ), nipple and areola compress able on the left                                              Infant awake and hungry, milk easily expressed off, LC assisted infant to latch while compressing                                              The breast tissue above the areola. "Ok Anis" sustained the latch for 20 mins in a consistent swallowing                                               Pattern with multiply swallows and gulps, and when he became non-nutritive had mo un-latch him.                                               After weight, Ok Anis was acting hungry re-latched and after 5-8 mins released and was content.                                               Because Ok Anis latched so well he did a great job softening the left breast ( upper portion of breast still                                               Needing some engorgement tx .  The right breast had been iced for 15-20 mins and with a hand pump and hand expressing obtained 60 ml                                                Mom reported comfort although LC felt when she probably needed to go home and post pump 10 -15 mins to                                                Soften up breast .  Mom has great supply at this point                                              Last feeding per mom @1230pm - 30 ml in a bottle   Initial Feeding Assessment: Pre-feed Weight: 5-1.o oz 2296g   , reweigh due to a wet and stool - 2nd weight - 5-0.6 oz 2284g  Post-feed Weight: 5-1.4 oz   2308g  Amount Transferred: 24 ml  Comments - see note above for evaluation of feeding   Additional Feeding Assessment: relatch on same breast  Pre-feed Weight: 5-1.4 oz 2308g  Post-feed Weight: 5-1.4 oz 2308g  Amount Transferred:  zero Comments: relatched on same breast for short feeding 5-8 mins but did not transfer enough to tip the scale. Mom pumped off 60 ml off 2nd breast                       And LC recommended feeding 2nd part of the feeding from the bottle to give him easy calories. Significant other was feeding some of the bottle before leaving the consult and was going to finish up in the car.    Total Breast milk Transferred this Visit: 24ml  Total Supplement Given: started at the end of consult and planned to finish in the car  Lactation Plan of Care- 1) Mom -Naps, rest , plenty flds ( esp H20) , nutritious meals and snacks                                          2) Engorgement tx - ICE 15 -20 mins if breast are greater than full heading to firm                                                                             -Goal- To decrease swelling so milk will flow                                           3) Steps to latch -  breast massage , hand express , prepump if breast are full , Nipple areola needs to compress                                                                          Well like a sandwich. Latch and compress breast tissue above areola has you are latching.                                                Depth at the breast is important !                                            4) Feeding skin to skin                                            5) If Kingston feeds both sides , still may need to pump 10 mins                                            6) If [place] only feeds 1st breast pump other 15 - 20 mins                                              Feedings where he doesn't latch need to pump instead       Important goal - Establish and protect milk supply either by feeding at the breast or pumping                                    Due to [place] being  Early 37 week baby breast feeding will get easier , also he needs to get use to your supply.   Follow-Up- Per mom 10/11 Selinda Michaels weight check and repeat  Bilirubin                   -Per mom Monday Smart start weight check                    - Per LC recommendation - [facility] at [facility] for support                   - 10/17 Thurs. F/U feeding assessment at 4pm [facility]     praised mom for her great efforts and for her partners support. Encouraged to call for questions before her consult next week.    Kathrin Greathouse 09/16/2012, 4:10 PM

## 2012-09-17 ENCOUNTER — Ambulatory Visit (HOSPITAL_COMMUNITY): Payer: BC Managed Care – PPO

## 2012-09-20 ENCOUNTER — Inpatient Hospital Stay (HOSPITAL_COMMUNITY)
Admission: AD | Admit: 2012-09-20 | Discharge: 2012-09-20 | Disposition: A | Discharge status: Home / Self Care | Payer: BC Managed Care – PPO | Source: Ambulatory Visit | Attending: Obstetrics & Gynecology | Admitting: Obstetrics & Gynecology

## 2012-09-20 ENCOUNTER — Encounter (HOSPITAL_COMMUNITY): Payer: Self-pay | Admitting: *Deleted

## 2012-09-20 DIAGNOSIS — O99893 Other specified diseases and conditions complicating puerperium: Secondary | ICD-10-CM | POA: Insufficient documentation

## 2012-09-20 DIAGNOSIS — O165 Unspecified maternal hypertension, complicating the puerperium: Secondary | ICD-10-CM

## 2012-09-20 DIAGNOSIS — R51 Headache: Secondary | ICD-10-CM | POA: Insufficient documentation

## 2012-09-20 DIAGNOSIS — R0989 Other specified symptoms and signs involving the circulatory and respiratory systems: Secondary | ICD-10-CM

## 2012-09-20 DIAGNOSIS — IMO0002 Reserved for concepts with insufficient information to code with codable children: Secondary | ICD-10-CM

## 2012-09-20 DIAGNOSIS — R030 Elevated blood-pressure reading, without diagnosis of hypertension: Secondary | ICD-10-CM | POA: Insufficient documentation

## 2012-09-20 LAB — COMPREHENSIVE METABOLIC PANEL
Alkaline Phosphatase: 92 U/L (ref 39–117)
BUN: 12 mg/dL (ref 6–23)
GFR calc Af Amer: >90 mL/min (ref >90)
Glucose, Bld: 105 mg/dL — ABNORMAL HIGH (ref 70–99)
Potassium: 3.8 mEq/L (ref 3.5–5.1)
Total Bilirubin: 0.2 mg/dL — ABNORMAL LOW (ref 0.3–1.2)
Total Protein: 6.8 g/dL (ref 6.0–8.3)

## 2012-09-20 LAB — CBC
HCT: 34.6 % — ABNORMAL LOW (ref 36.0–46.0)
Hemoglobin: 11.8 g/dL — ABNORMAL LOW (ref 12.0–15.0)
MCH: 28.5 pg (ref 26.0–34.0)
MCHC: 34.1 g/dL (ref 30.0–36.0)

## 2012-09-20 LAB — URINALYSIS, ROUTINE W REFLEX MICROSCOPIC
Bilirubin Urine: NEGATIVE
Glucose, UA: NEGATIVE mg/dL
Ketones, ur: NEGATIVE mg/dL
pH: 6 (ref 5.0–8.0)

## 2012-09-20 LAB — URINE MICROSCOPIC-ADD ON

## 2012-09-20 LAB — PROTEIN / CREATININE RATIO, URINE: Creatinine, Urine: 253.28 mg/dL

## 2012-09-20 NOTE — MAU Provider Note (Signed)
History     CSN: [id number]  Arrival date and time: 09/20/12 7829   First Provider Initiated Contact with Patient 09/20/12 2021      Chief Complaint  Patient presents with  . Hypertension   HPI Tamara May is a 21yo [id number] s/p vag del x 8d who presents from home health visit BP check with elevated BP. Reports H/A yesterday but is improving today since taking a nap. Denies RUQ pain or blurred vision. Is breastfeeding. Feels that swelling is improving since delivery.  OB History    Grav Para Term Preterm Abortions TAB SAB Ect Mult Living   3 1 1  2 1 1   1       Past Medical History  Diagnosis Date  . Headache   . Seizures     as a child    Past Surgical History  Procedure Date  . Wisdom tooth extraction   . Mouth surgery   . No past surgeries     Family History  Problem Relation Age of Onset  . Anesthesia problems Neg Hx   . Hypertension Mother   . Cancer Mother     breast x2  . Asthma Mother   . Hypertension Father   . Gout Father   . Diabetes Maternal Grandmother   . Vision loss Maternal Grandmother     cataracts  . Stroke Maternal Grandmother   . Diabetes Paternal Grandmother   . Alcohol abuse Paternal Grandmother   . Vision loss Maternal Uncle     History  Substance Use Topics  . Smoking status: Never Smoker   . Smokeless tobacco: Not on file  . Alcohol Use: No    Allergies: No Known Allergies  Prescriptions prior to admission  Medication Sig Dispense Refill  . ibuprofen (ADVIL,MOTRIN) 600 MG tablet Take 1 tablet (600 mg total) by mouth every 6 (six) hours.  30 tablet  5  . oxyCODONE-acetaminophen (PERCOCET/ROXICET) 5-325 MG per tablet Take 1-2 tablets by mouth every 3 (three) hours as needed (moderate - severe pain).  40 tablet  0  . Prenatal Vit-Fe Fumarate-FA (PRENATAL MULTIVITAMIN) TABS Take 1 tablet by mouth daily.        ROS Physical Exam   Blood pressure 130/91, pulse 75, temperature 98.7 F (37.1 C), temperature source Oral, resp.  rate 20, height 5\' 2"  (1.575 m), weight 91.627 kg (202 lb), last menstrual period 12/25/2011, SpO2 99.00%, currently breastfeeding. BPs: 129/85, 131/79 Physical Exam  Constitutional: She is oriented to person, place, and time. She appears well-developed.  HENT:  Head: Normocephalic.  Cardiovascular: Normal rate.   Respiratory: Effort normal.  Musculoskeletal: Normal range of motion.       Sl edema lower ext  Neurological: She is alert and oriented to person, place, and time.  Skin: Skin is warm and dry.  Psychiatric: She has a normal mood and affect. Her behavior is normal. Thought content normal.   CBC    Component Value Date/Time   WBC 8.2 09/20/2012 2014   RBC 4.14 09/20/2012 2014   HGB 11.8* 09/20/2012 2014   HCT 34.6* 09/20/2012 2014   PLT 281 09/20/2012 2014   MCV 83.6 09/20/2012 2014   MCH 28.5 09/20/2012 2014   MCHC 34.1 09/20/2012 2014   RDW 14.1 09/20/2012 2014   CMP     Component Value Date/Time   NA 140 09/20/2012 2014   K 3.8 09/20/2012 2014   CL 105 09/20/2012 2014   CO2 22 09/20/2012 2014   GLUCOSE 105*  09/20/2012 2014   BUN 12 09/20/2012 2014   CREATININE 0.76 09/20/2012 2014   CALCIUM 8.7 09/20/2012 2014   PROT 6.8 09/20/2012 2014   ALBUMIN 3.3* 09/20/2012 2014   AST 14 09/20/2012 2014   ALT 15 09/20/2012 2014   ALKPHOS 92 09/20/2012 2014   BILITOT 0.2* 09/20/2012 2014   GFRNONAA >90 09/20/2012 2014   GFRAA >90 09/20/2012 2014   Urinalysis    Component Value Date/Time   COLORURINE YELLOW 09/20/2012 1737   APPEARANCEUR CLEAR 09/20/2012 1737   LABSPEC >1.030* 09/20/2012 1737   PHURINE 6.0 09/20/2012 1737   GLUCOSEU NEGATIVE 09/20/2012 1737   HGBUR LARGE* 09/20/2012 1737   BILIRUBINUR NEGATIVE 09/20/2012 1737   KETONESUR NEGATIVE 09/20/2012 1737   PROTEINUR NEGATIVE 09/20/2012 1737   UROBILINOGEN 0.2 09/20/2012 1737   NITRITE NEGATIVE 09/20/2012 1737   LEUKOCYTESUR SMALL* 09/20/2012 1737      MAU Course  Procedures  MDM Rev'd pt with  Dr Tamela Oddi; decision made to d/c home and have blood pressure check at the office on 09/23/12  Assessment and Plan  S/p vag del on 09/12/12 Labile BP Headache off and on  Will d/c home since labs are nl Follow up at [facility] on 09/23/12 or sooner if develops warning signs of preeclampsia Continue Ibuprofen and Percocet prn (rec'd scripts at d/c after delivery)  Cam Hai 09/20/2012, 9:35 PM

## 2012-09-20 NOTE — MAU Note (Signed)
Home visit today by RN, B/P 160/100. S/P vaginal delivery 10/06. Headache x 2 days.

## 2012-09-23 ENCOUNTER — Ambulatory Visit (HOSPITAL_COMMUNITY): Admission: RE | Admit: 2012-09-23 | Payer: BC Managed Care – PPO | Source: Ambulatory Visit

## 2013-09-19 ENCOUNTER — Other Ambulatory Visit: Payer: Self-pay | Admitting: Emergency Medicine

## 2013-09-19 ENCOUNTER — Other Ambulatory Visit (HOSPITAL_COMMUNITY): Payer: Self-pay | Admitting: Internal Medicine

## 2013-09-19 ENCOUNTER — Other Ambulatory Visit (HOSPITAL_COMMUNITY)
Admission: RE | Admit: 2013-09-19 | Discharge: 2013-09-19 | Disposition: A | Discharge status: Home / Self Care | Payer: BC Managed Care – PPO | Source: Ambulatory Visit | Attending: Internal Medicine | Admitting: Internal Medicine

## 2013-09-19 ENCOUNTER — Ambulatory Visit (HOSPITAL_COMMUNITY)
Admission: RE | Admit: 2013-09-19 | Discharge: 2013-09-19 | Disposition: A | Discharge status: Home / Self Care | Payer: BC Managed Care – PPO | Source: Ambulatory Visit | Attending: Internal Medicine | Admitting: Internal Medicine

## 2013-09-19 DIAGNOSIS — Z01419 Encounter for gynecological examination (general) (routine) without abnormal findings: Secondary | ICD-10-CM | POA: Insufficient documentation

## 2013-09-19 DIAGNOSIS — M549 Dorsalgia, unspecified: Secondary | ICD-10-CM

## 2013-09-20 IMAGING — US US OB TRANSVAGINAL
1 series · 13 of 28 positions shown · non-contrast
Comparison: Pelvic ultrasound performed 02/09/2009

CLINICAL DATA: Pelvic pain for 1 week.

OBSTETRIC <14 WK US AND TRANSVAGINAL OB US
TECHNIQUE: Both transabdominal and transvaginal ultrasound
examinations were performed for complete evaluation of the
gestation as well as the maternal uterus, adnexal regions, and
pelvic cul-de-sac.  Transvaginal technique was performed to assess
early pregnancy.

[Series 1: us ob comp less 14 wks · 13 of 34 slices shown]
[im 2/34]
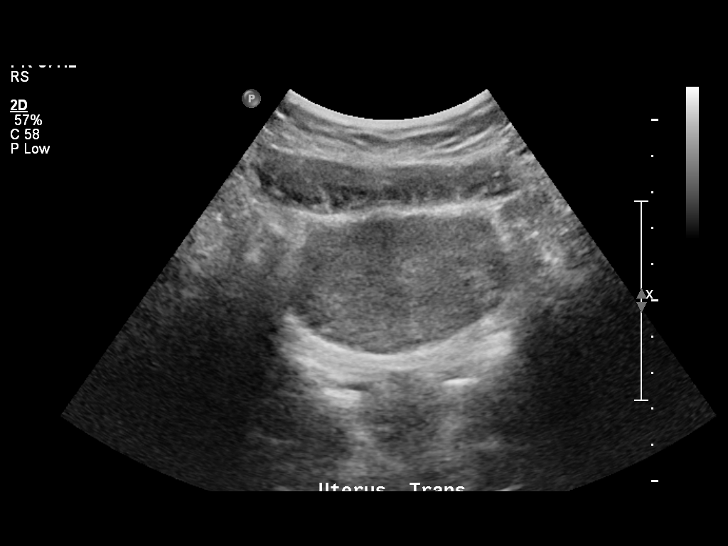
[im 4/34]
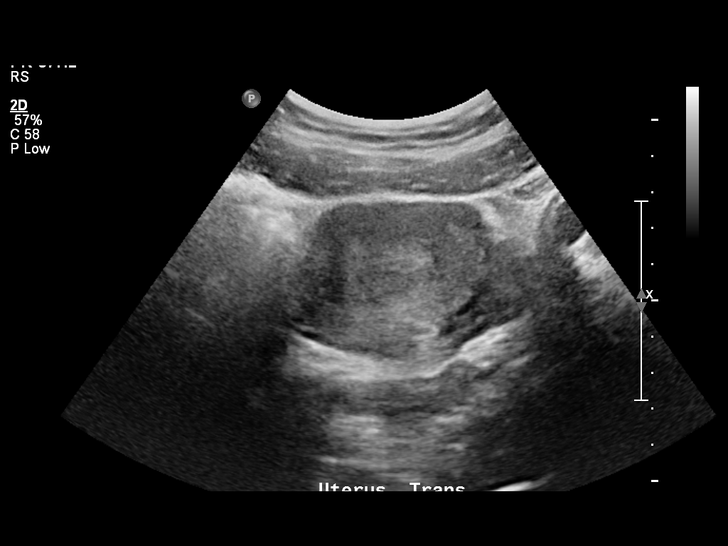
[im 7/34]
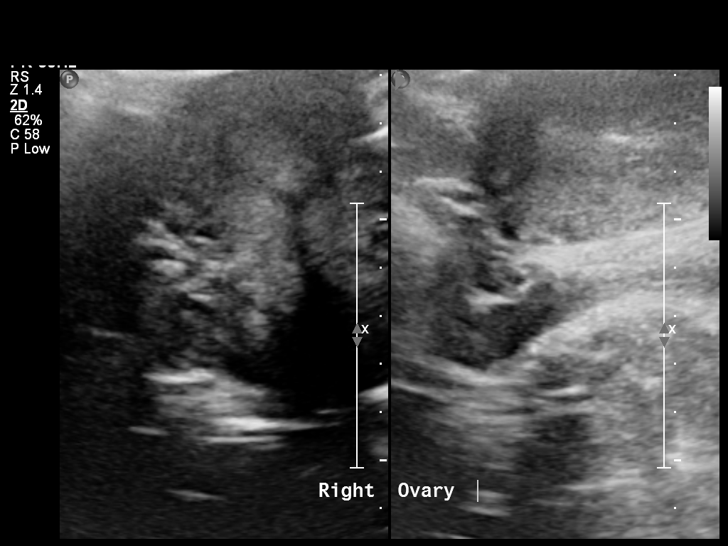
[im 9/34]
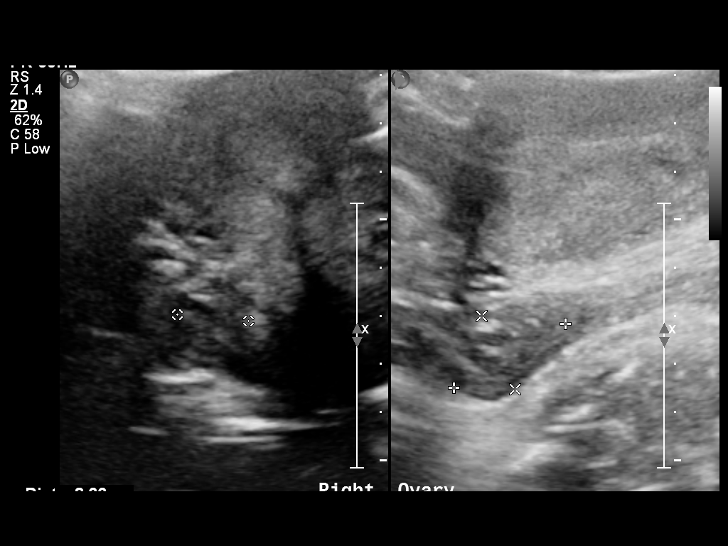
[im 12/34]
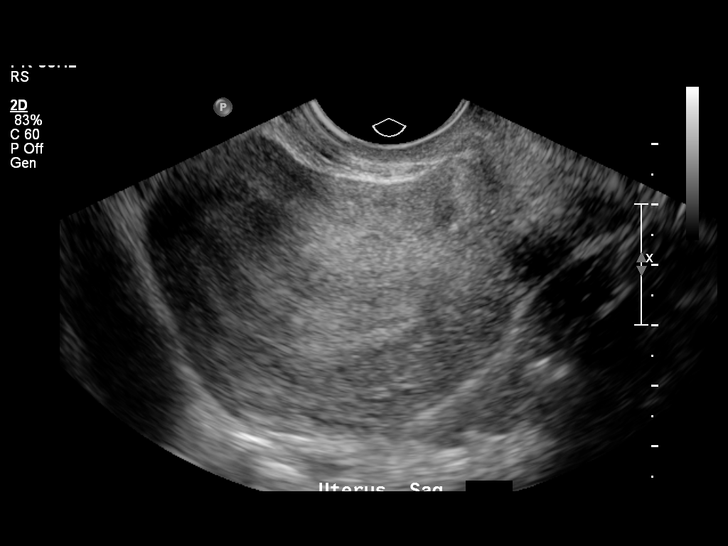
[im 14/34]
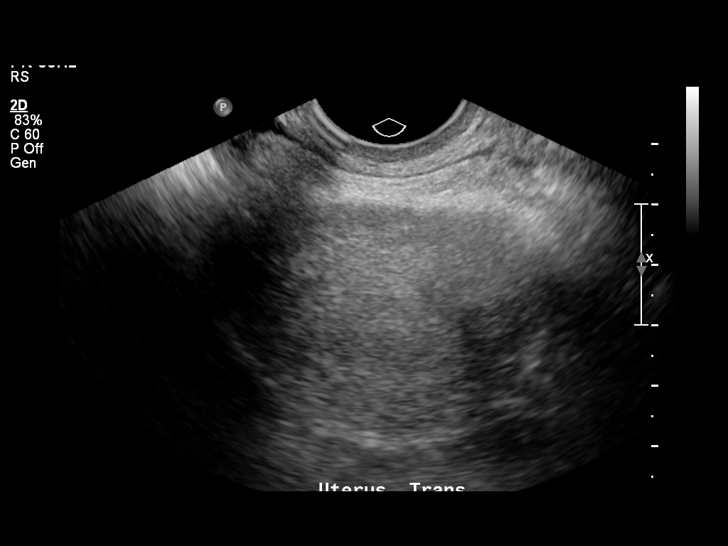
[im 18/34]
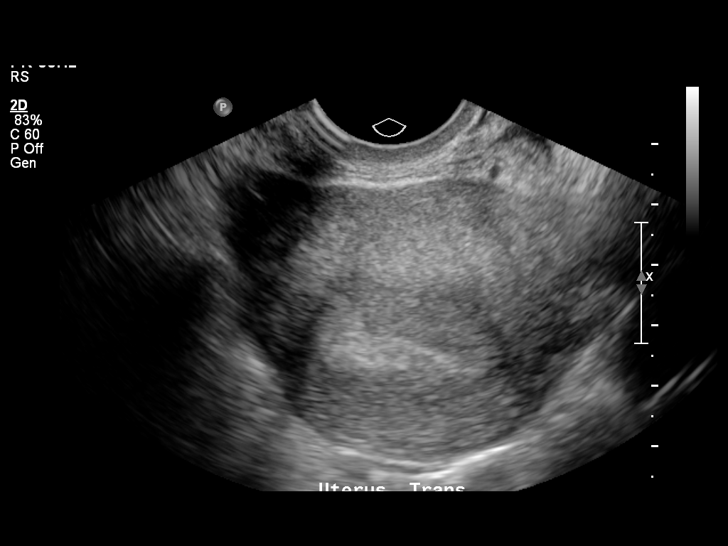
[im 20/34]
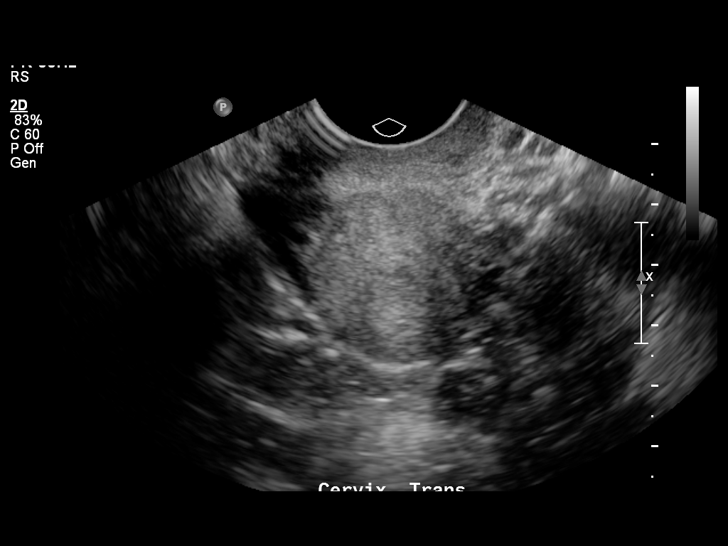
[im 23/34]
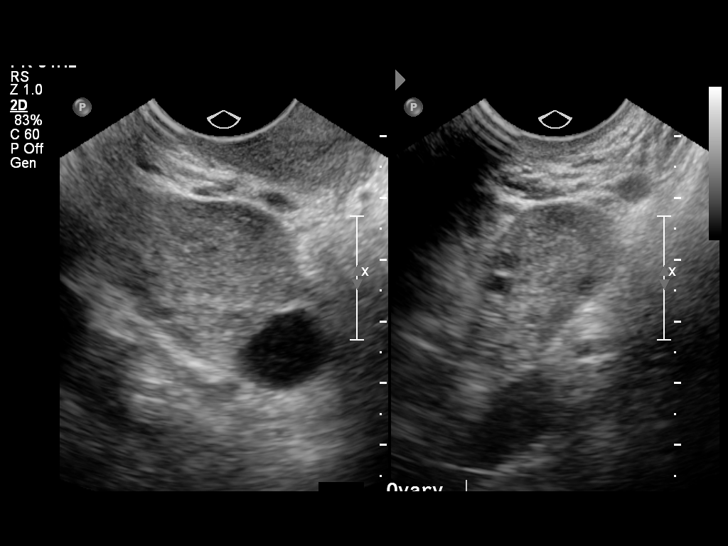
[im 25/34]
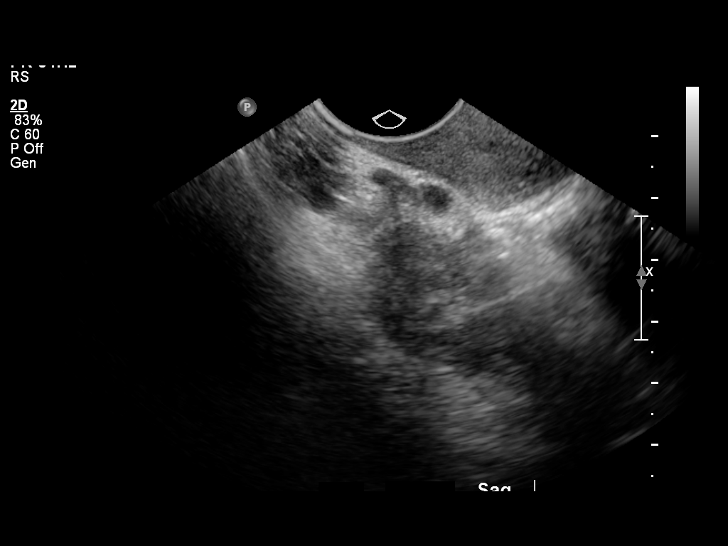
[im 27/34]
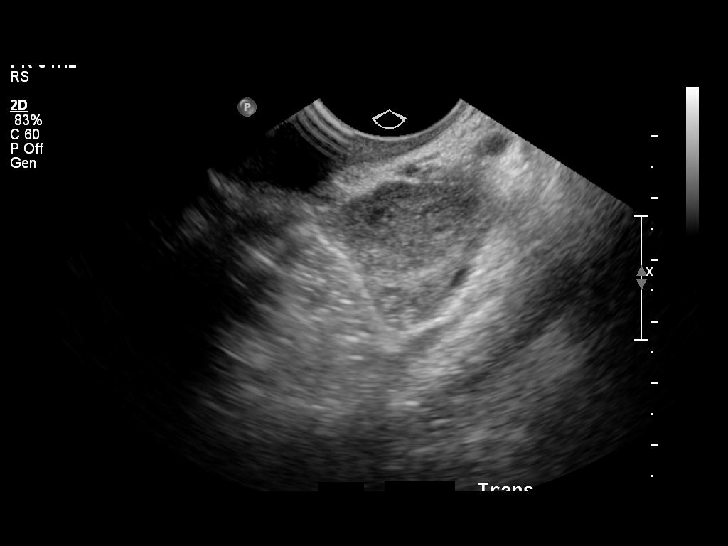
[im 30/34]
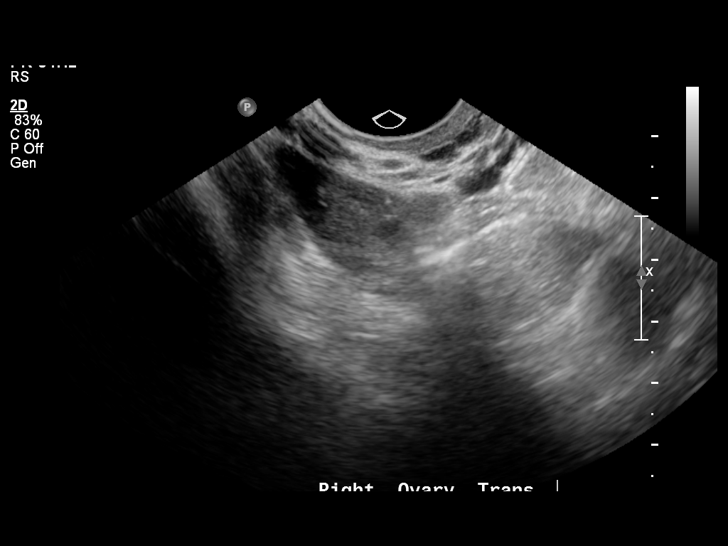
[im 32/34]
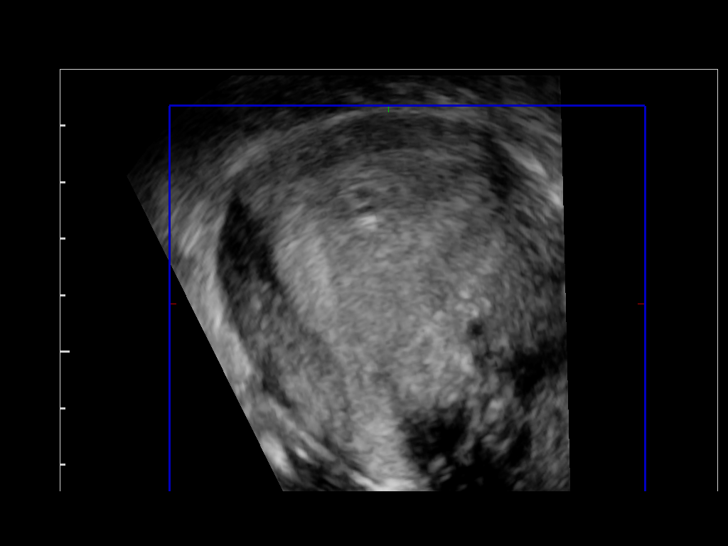

[13 of 28 positions shown; findings below may reference images not displayed]

Intrauterine gestational sac:  Question of tiny gestational sac,
too small to fully define.
Yolk sac: No
Embryo: No
Cardiac Activity: N/A

MSD: 2.0  mm  4    w 4    d         US EDC: 10/02/2012

Maternal uterus/adnexae:
The uterus is otherwise unremarkable in appearance.  There is no
evidence of subchorionic hemorrhage.

The ovaries are within normal limits.  The right ovary measures
x 1.6 x 1.8 cm, while the left ovary measures 4.1 x 2.5 x 2.1 cm.
There is no evidence for ovarian torsion; no suspicious adnexal
masses are seen.  There is no evidence for ectopic pregnancy.

No free fluid is seen within the pelvic cul-de-sac.
IMPRESSION: Suspect tiny intrauterine gestational sac, measuring 2 mm,
corresponding to a gestational age of 4 weeks 4 days.  This matches
the gestational age of 4 weeks 6 days by LMP, reflecting an
estimated date of delivery September 30, 2012.  If the patient's
symptoms persist, follow-up pelvic ultrasound could be considered
in 1 to 2 weeks, for further evaluation.

## 2013-10-31 ENCOUNTER — Telehealth: Payer: Self-pay | Admitting: Internal Medicine

## 2013-10-31 NOTE — Telephone Encounter (Signed)
PT SAID SHE WAS UNAWARE OF 10-10-13 APPT. PT HAS BEEN WAITING FOR LAB RESULTS.  I UPDATED HER INFORMATION TO REFLECT CORRECT PHONE AND ADDRESS.  PLEASE CALL PT WITH 09-19-13 LABS

## 2013-11-01 NOTE — Telephone Encounter (Signed)
Spoke with patient about labs from 09/2013.  Patient declines lab recheck of Iron and CBC.  Declines abd u/s as suggested by Loree Fee, PA-C to evaluate abd pain and bloating.

## 2013-11-24 ENCOUNTER — Other Ambulatory Visit: Payer: Self-pay | Admitting: Emergency Medicine

## 2013-12-30 ENCOUNTER — Encounter: Payer: Self-pay | Admitting: Physician Assistant

## 2013-12-30 ENCOUNTER — Ambulatory Visit (INDEPENDENT_AMBULATORY_CARE_PROVIDER_SITE_OTHER): Payer: BC Managed Care – PPO | Admitting: Physician Assistant

## 2013-12-30 VITALS — BP 124/82 | HR 56 | Temp 99.9°F | Resp 16 | Wt 172.4 lb

## 2013-12-30 DIAGNOSIS — R5381 Other malaise: Secondary | ICD-10-CM

## 2013-12-30 DIAGNOSIS — E559 Vitamin D deficiency, unspecified: Secondary | ICD-10-CM

## 2013-12-30 DIAGNOSIS — I10 Essential (primary) hypertension: Secondary | ICD-10-CM

## 2013-12-30 DIAGNOSIS — E538 Deficiency of other specified B group vitamins: Secondary | ICD-10-CM

## 2013-12-30 DIAGNOSIS — D649 Anemia, unspecified: Secondary | ICD-10-CM

## 2013-12-30 DIAGNOSIS — Z79899 Other long term (current) drug therapy: Secondary | ICD-10-CM

## 2013-12-30 DIAGNOSIS — R5383 Other fatigue: Secondary | ICD-10-CM

## 2013-12-30 DIAGNOSIS — R7309 Other abnormal glucose: Secondary | ICD-10-CM

## 2013-12-30 DIAGNOSIS — R7303 Prediabetes: Secondary | ICD-10-CM

## 2013-12-30 LAB — HEPATIC FUNCTION PANEL
ALBUMIN: 4 g/dL (ref 3.5–5.2)
ALT: 8 U/L (ref 0–35)
AST: 11 U/L (ref 0–37)
Alkaline Phosphatase: 73 U/L (ref 39–117)
BILIRUBIN DIRECT: 0.1 mg/dL (ref 0.0–0.3)
BILIRUBIN TOTAL: 0.3 mg/dL (ref 0.3–1.2)
Indirect Bilirubin: 0.2 mg/dL (ref 0.0–0.9)
Total Protein: 7.1 g/dL (ref 6.0–8.3)

## 2013-12-30 LAB — CBC WITH DIFFERENTIAL/PLATELET
BASOS ABS: 0 10*3/uL (ref 0.0–0.1)
BASOS PCT: 0 % (ref 0–1)
EOS ABS: 0.1 10*3/uL (ref 0.0–0.7)
Eosinophils Relative: 2 % (ref 0–5)
HCT: 34.2 % — ABNORMAL LOW (ref 36.0–46.0)
Hemoglobin: 11.3 g/dL — ABNORMAL LOW (ref 12.0–15.0)
Lymphocytes Relative: 31 % (ref 12–46)
Lymphs Abs: 2 10*3/uL (ref 0.7–4.0)
MCH: 26.5 pg (ref 26.0–34.0)
MCHC: 33 g/dL (ref 30.0–36.0)
MCV: 80.3 fL (ref 78.0–100.0)
Monocytes Absolute: 0.3 10*3/uL (ref 0.1–1.0)
Monocytes Relative: 5 % (ref 3–12)
NEUTROS ABS: 4.1 10*3/uL (ref 1.7–7.7)
NEUTROS PCT: 62 % (ref 43–77)
PLATELETS: 287 10*3/uL (ref 150–400)
RBC: 4.26 MIL/uL (ref 3.87–5.11)
RDW: 14.8 % (ref 11.5–15.5)
WBC: 6.6 10*3/uL (ref 4.0–10.5)

## 2013-12-30 LAB — TSH: TSH: 0.546 u[IU]/mL (ref 0.350–4.500)

## 2013-12-30 LAB — BASIC METABOLIC PANEL WITH GFR
BUN: 8 mg/dL (ref 6–23)
CALCIUM: 9 mg/dL (ref 8.4–10.5)
CHLORIDE: 107 meq/L (ref 96–112)
CO2: 29 meq/L (ref 19–32)
Creat: 0.63 mg/dL (ref 0.50–1.10)
GFR, Est African American: >89 mL/min
GFR, Est Non African American: >89 mL/min
Glucose, Bld: 90 mg/dL (ref 70–99)
POTASSIUM: 3.9 meq/L (ref 3.5–5.3)
SODIUM: 142 meq/L (ref 135–145)

## 2013-12-30 LAB — IRON AND TIBC
%SAT: 6 % — ABNORMAL LOW (ref 20–55)
Iron: 20 ug/dL — ABNORMAL LOW (ref 42–145)
TIBC: 340 ug/dL (ref 250–470)
UIBC: 320 ug/dL (ref 125–400)

## 2013-12-30 LAB — MAGNESIUM: Magnesium: 2 mg/dL (ref 1.5–2.5)

## 2013-12-30 LAB — FERRITIN: FERRITIN: 12 ng/mL (ref 10–291)

## 2013-12-30 LAB — HEMOGLOBIN A1C
HEMOGLOBIN A1C: 5.8 % — AB (ref <5.7)
MEAN PLASMA GLUCOSE: 120 mg/dL — AB (ref <117)

## 2013-12-30 LAB — VITAMIN B12: Vitamin B-12: 450 pg/mL (ref 211–911)

## 2013-12-30 NOTE — Patient Instructions (Signed)
What is the TMJ? The temporomandibular (tem-PUH-ro-man-DIB-yoo-ler) joint, or the TMJ, connects the upper and lower jawbones. This joint allows the jaw to open wide and move back and forth when you chew, talk, or yawn.There are also several muscles that help this joint move. There can be muscle tightness and pain in the muscle that can cause several symptoms.  What causes TMJ pain? There are many causes of TMJ pain. Repeated chewing (for example, chewing gum) and clenching your teeth can cause pain in the joint. Some TMJ pain has no obvious cause. What can I do to ease the pain? There are many things you can do to help your pain get better. When you have pain:  Eat soft foods and stay away from chewy foods (for example, taffy) Try to use both sides of your mouth to chew Don't chew gum Don't open your mouth wide (for example, during yawning or singing) Don't bite your cheeks or fingernails Lower your amount of stress and worry Applying a warm, damp washcloth to the joint may help. Over-the-counter pain medicines such as ibuprofen (one brand: Advil) or acetaminophen (one brand: Tylenol) might also help. Do not use these medicines if you are allergic to them or if your doctor told you not to use them. How can I stop the pain from coming back? When your pain is better, you can do these exercises to make your muscles stronger and to keep the pain from coming back:  Resisted mouth opening: Place your thumb or two fingers under your chin and open your mouth slowly, pushing up lightly on your chin with your thumb. Hold for three to six seconds. Close your mouth slowly. Resisted mouth closing: Place your thumbs under your chin and your two index fingers on the ridge between your mouth and the bottom of your chin. Push down lightly on your chin as you close your mouth. Tongue up: Slowly open and close your mouth while keeping the tongue touching the roof of the mouth. Side-to-side jaw movement: Place an  object about one fourth of an inch thick (for example, two tongue depressors) between your front teeth. Slowly move your jaw from side to side. Increase the thickness of the object as the exercise becomes easier Forward jaw movement: Place an object about one fourth of an inch thick between your front teeth and move the bottom jaw forward so that the bottom teeth are in front of the top teeth. Increase the thickness of the object as the exercise becomes easier. These exercises should not be painful. If it hurts to do these exercises, stop doing them and talk to your family doctor.     Bad carbs also include fruit juice, alcohol, and sweet tea. These are empty calories that do not signal to your brain that you are full.   Please remember the good carbs are still carbs which convert into sugar. So please measure them out no more than 1/2-1 cup of rice, oatmeal, pasta, and beans.  Veggies are however free foods! Pile them on.   I like lean protein at every meal such as chicken, turkey, pork chops, cottage cheese, etc. Just do not fry these meats and please center your meal around vegetable, the meats should be a side dish.   No all fruit is created equal. Please see the list below, the fruit at the bottom is higher in sugars than the fruit at the top    

## 2013-12-30 NOTE — Progress Notes (Signed)
HPI Patient presents for 3 month follow up with labile hypertension, hyperlipidemia, prediabetes and vitamin D. Patient's blood pressure has been controlled at home, today their BP is BP: 124/82 mmHg  Patient denies chest pain, shortness of breath, dizziness.  Patient's cholesterol is diet controlled The patient has been working on diet and exercise for prediabetes, and denies changes in vision, polys, and paresthesias. A1C 5.8 Insulin 116 Anemia due to heavy menses causes iron deficiency.  Patient is planning on doing brazilian butt lift at the end of Feb and needs lab work.  Patient is on Vitamin D supplement.   Current Medications:  Current Outpatient Prescriptions on File Prior to Visit  Medication Sig Dispense Refill  . ibuprofen (ADVIL,MOTRIN) 600 MG tablet Take 1 tablet (600 mg total) by mouth every 6 (six) hours.  30 tablet  5  . Prenatal Vit-Fe Fumarate-FA (PRENATAL MULTIVITAMIN) TABS Take 1 tablet by mouth daily.      Marland Kitchen. oxyCODONE-acetaminophen (PERCOCET/ROXICET) 5-325 MG per tablet Take 1-2 tablets by mouth every 3 (three) hours as needed (moderate - severe pain).  40 tablet  0   No current facility-administered medications on file prior to visit.   Medical History:  Past Medical History  Diagnosis Date  . Headache(784.0)   . Seizures     as a child  . Anemia   . Prediabetes   . B12 deficiency    Allergies: No Known Allergies  ROS Constitutional: Denies fever, chills, headaches, insomnia, fatigue, night sweats Eyes: Denies redness, blurred vision, diplopia, discharge, itchy, watery eyes.  ENT: Denies congestion, post nasal drip, sore throat, earache, dental pain, Tinnitus, Vertigo, Sinus pain, snoring.  Cardio: Denies chest pain, palpitations, irregular heartbeat, dyspnea, diaphoresis, orthopnea, PND, claudication, edema Respiratory: denies cough, shortness of breath, wheezing.  Gastrointestinal: Denies dysphagia, heartburn, AB pain/ cramps, N/V, diarrhea, constipation,  hematemesis, melena, hematochezia,  hemorrhoids Genitourinary: Denies dysuria, frequency, urgency, nocturia, hesitancy, discharge, hematuria, flank pain Musculoskeletal: Denies myalgia, stiffness, pain, swelling and strain/sprain. Skin: Denies pruritis, rash, changing in skin lesion Neuro: Denies Weakness, tremor, incoordination, spasms, pain Psychiatric: Denies confusion, memory loss, sensory loss Endocrine: Denies change in weight, skin, hair change, nocturia Diabetic Polys, Denies visual blurring, hyper /hypo glycemic episodes, and paresthesia, Heme/Lymph: Denies Excessive bleeding, bruising, enlarged lymph nodes  Family history- Review and unchanged Social history- Review and unchanged Physical Exam: Filed Vitals:   12/30/13 0950  BP: 124/82  Pulse: 56  Temp: 99.9 F (37.7 C)  Resp: 16   Filed Weights   12/30/13 0950  Weight: 172 lb 6.4 oz (78.2 kg)   General Appearance: Well nourished, in no apparent distress. Eyes: PERRLA, EOMs, conjunctiva no swelling or erythema Sinuses: No Frontal/maxillary tenderness ENT/Mouth: Ext aud canals clear, TMs without erythema, bulging. No erythema, swelling, or exudate on post pharynx.  Tonsils not swollen or erythematous. Hearing normal.  Neck: Supple, thyroid normal.  Respiratory: Respiratory effort normal, BS equal bilaterally without rales, rhonchi, wheezing or stridor.  Cardio: RRR with no MRGs. Brisk peripheral pulses without edema.  Abdomen: Soft, + BS.  Non tender, no guarding, rebound, hernias, masses. Lymphatics: Non tender without lymphadenopathy.  Musculoskeletal: Full ROM, 5/5 strength, normal gait.  Skin: Warm, dry without rashes, lesions, ecchymosis.  Neuro: Cranial nerves intact. Normal muscle tone, no cerebellar symptoms. Sensation intact.  Psych: Awake and oriented X 3, normal affect, Insight and Judgment appropriate.   Assessment and Plan:  Hypertension: Continue medication, monitor blood pressure at home.  Continue  DASH diet. Cholesterol: Continue diet and  exercise. Check cholesterol.  Pre-diabetes-Continue diet and exercise. Check A1C Vitamin D Def- check level and continue medications.  Anemia check labs  Continue diet and meds as discussed. Further disposition pending results of labs.  Quentin Mulling 10:12 AM

## 2013-12-31 LAB — VITAMIN D 25 HYDROXY (VIT D DEFICIENCY, FRACTURES): VIT D 25 HYDROXY: 20 ng/mL — AB (ref 30–89)

## 2013-12-31 LAB — INSULIN, FASTING: Insulin fasting, serum: 12 u[IU]/mL (ref 3–28)

## 2014-02-20 ENCOUNTER — Encounter: Payer: Self-pay | Admitting: Physician Assistant

## 2014-02-20 ENCOUNTER — Ambulatory Visit (INDEPENDENT_AMBULATORY_CARE_PROVIDER_SITE_OTHER): Payer: BC Managed Care – PPO | Admitting: Physician Assistant

## 2014-02-20 VITALS — BP 120/78 | HR 80 | Temp 98.1°F | Resp 16 | Wt 172.0 lb

## 2014-02-20 DIAGNOSIS — R1907 Generalized intra-abdominal and pelvic swelling, mass and lump: Secondary | ICD-10-CM

## 2014-02-20 DIAGNOSIS — Z09 Encounter for follow-up examination after completed treatment for conditions other than malignant neoplasm: Secondary | ICD-10-CM

## 2014-02-20 DIAGNOSIS — D692 Other nonthrombocytopenic purpura: Secondary | ICD-10-CM

## 2014-02-20 DIAGNOSIS — R109 Unspecified abdominal pain: Secondary | ICD-10-CM

## 2014-02-20 LAB — CBC WITH DIFFERENTIAL/PLATELET
BASOS ABS: 0 10*3/uL (ref 0.0–0.1)
Basophils Relative: 0 % (ref 0–1)
Eosinophils Absolute: 0.2 10*3/uL (ref 0.0–0.7)
Eosinophils Relative: 3 % (ref 0–5)
HEMATOCRIT: 23.3 % — AB (ref 36.0–46.0)
Hemoglobin: 7.7 g/dL — ABNORMAL LOW (ref 12.0–15.0)
LYMPHS PCT: 27 % (ref 12–46)
Lymphs Abs: 2 10*3/uL (ref 0.7–4.0)
MCH: 26.6 pg (ref 26.0–34.0)
MCHC: 33 g/dL (ref 30.0–36.0)
MCV: 80.6 fL (ref 78.0–100.0)
Monocytes Absolute: 0.3 10*3/uL (ref 0.1–1.0)
Monocytes Relative: 4 % (ref 3–12)
NEUTROS ABS: 4.9 10*3/uL (ref 1.7–7.7)
Neutrophils Relative %: 66 % (ref 43–77)
PLATELETS: 356 10*3/uL (ref 150–400)
RBC: 2.89 MIL/uL — AB (ref 3.87–5.11)
RDW: 15.4 % (ref 11.5–15.5)
WBC: 7.4 10*3/uL (ref 4.0–10.5)

## 2014-02-20 LAB — BASIC METABOLIC PANEL WITH GFR
BUN: 11 mg/dL (ref 6–23)
CHLORIDE: 106 meq/L (ref 96–112)
CO2: 27 mEq/L (ref 19–32)
Calcium: 8.4 mg/dL (ref 8.4–10.5)
Creat: 0.63 mg/dL (ref 0.50–1.10)
GFR, Est African American: >89 mL/min
Glucose, Bld: 81 mg/dL (ref 70–99)
POTASSIUM: 4.1 meq/L (ref 3.5–5.3)
Sodium: 139 mEq/L (ref 135–145)

## 2014-02-20 LAB — HEPATIC FUNCTION PANEL
ALK PHOS: 55 U/L (ref 39–117)
ALT: 11 U/L (ref 0–35)
AST: 13 U/L (ref 0–37)
Albumin: 3.4 g/dL — ABNORMAL LOW (ref 3.5–5.2)
BILIRUBIN DIRECT: 0.1 mg/dL (ref 0.0–0.3)
BILIRUBIN INDIRECT: 0.2 mg/dL (ref 0.2–1.2)
BILIRUBIN TOTAL: 0.3 mg/dL (ref 0.2–1.2)
Total Protein: 5.9 g/dL — ABNORMAL LOW (ref 6.0–8.3)

## 2014-02-20 MED ORDER — HYDROCODONE-ACETAMINOPHEN 5-325 MG PO TABS: 1.0000 | ORAL_TABLET | Freq: Four times a day (QID) | ORAL | Status: DC | PRN

## 2014-02-20 MED ORDER — HYDROCODONE-ACETAMINOPHEN 5-325 MG PO TABS: 1.0000 | ORAL_TABLET | Freq: Two times a day (BID) | ORAL | Status: DC | PRN

## 2014-02-20 NOTE — Patient Instructions (Signed)
Lymphedema Lymphedema is a swelling caused by the abnormal collection of lymph under the skin. The lymph is fluid from the tissues in your body that travels in the lymphatic system. This system is part of the immune system that includes lymph nodes and vessels. The lymph vessels collect and carry the excess fluid, fats, proteins, and wastes from the tissues of the body to the bloodstream. This system also works to clean and remove bacteria and waste products from the body.  Lymphedema occurs when the lymphatic system is blocked. When the lymph vessels or lymph nodes are blocked or damaged, lymph does not drain properly. This causes abnormal build up of lymph. This leads to swelling in the arms or legs. Lymphedema cannot be cured by medicines. But the swelling can be reduced by physical methods. CAUSES  There are two types of lymphedema. Primary lymphedema is caused by the absence or abnormality of the lymph vessel at birth. It is also known as inherited lymphedema, which occurs rarely. Secondary or acquired lymphedema occurs when the lymph vessel is damaged or blocked. The causes of lymph vessel blockage are:   Skin infection like cellulites.  Infection by parasites (filariasis).  Injury.  Cancer.  Radiation therapy.  Formation of scar tissue.  Surgery. SYMPTOMS  The symptoms of lymphedema are:  Abnormal swelling of the arm or leg.  Heavy or tight feeling in your arm or leg.  Tight-fitting shoes or rings.  Redness of skin over the affected area.  Limited movement of the affected limb.  Some patients complain about sensitivity to touch and discomfort in the limb(s) affected. You may not have these symptoms immediately following injury. They usually appear within a few days or even years after injury. Inform your caregiver, if you have any of these symptoms. Early treatment can avoid further problems.  DIAGNOSIS  First, your caregiver will inquire about any surgery you have had or  medicines you are taking. He will then examine you. Your caregiver may order special imaging tests, such as:  Lymphoscintigraphy (a test in which a low dose of radioactive substance is injected to trace the flow of lymph through the lymph vessels).  MRI (imaging tests using magnetic fields).  Computed tomography (test using special cross-sectional X-rays).  Duplex ultrasound (test using high-frequency sound waves to show the vessels and the blood flow on a screen).  Lymphangiography (special X-ray taken after injecting a contrast dye into the lymph vessel). It is now rarely done. TREATMENT  Lymphedema can be treated in different ways. Your caregiver will decide the type of treatment depending on the cause. Treatment may include:  Exercise: Special exercises will help fluid move out easily from the affected part. This should be done as per your caregiver's advice.  Manual lymph drainage: Gentle massage of the affected limb makes the fluid to move out more freely.  Compression: Compression stockings or external pump apply pressure over the affected limb. This helps the fluid to move out from the arm or leg. Bandaging can also help to move the fluid out from the affected part. Your caregiver will decide the method that suits you the best.  Medicines: Your caregiver may prescribe antibiotics, if you have infection.  Surgery: Your caregiver may advise surgery for severe lymphedema. It is reserved for special cases when the patient has difficulty moving. Your surgeon may remove excess tissue from the arm or leg. This will help to ease your movement. Physical therapy may have to be continued after surgery. HOME CARE INSTRUCTIONS    The area is very fragile and is predisposed to injury and infection.  Eat a healthy diet.  Exercise regularly as per advice.  Keep the affected area clean and dry.  Use gloves while cooking or gardening.  Protect your skin from cuts.  Use electric razor to  shave the affected area.  Keep affected limb elevated.  Do not wear tight clothes, shoes, or jewelry as it may cause the tissue to be strangled.  Do not use heat pads over the affected area.  Do not sit with cross legs.  Do not walk barefoot.  Do not carry weight on the affected arm.  Avoid having blood pressure checked on the affected limb. SEEK MEDICAL CARE IF:  You continue to have swelling in your limb. SEEK IMMEDIATE MEDICAL CARE IF:   You have high fever.  You have skin rash.  You have chills or sweats.  You have pain or redness.  You have a cut that does not heal. MAKE SURE YOU:   Understand these instructions.  Will watch your condition.  Will get help right away if you are not doing well or get worse. Document Released: 09/21/2007 Document Revised: 11/10/2012 Document Reviewed: 08/27/2009 [employer] Patient Information 2014 [employer].  

## 2014-02-20 NOTE — Progress Notes (Signed)
   Subjective:    Patient ID: Tamara May, female    DOB: [birth year], 23 y.o.   MRN: [id number]  HPI 23 y.o. female s/p 12 days brazilian butt lift. March 4th, she got [place], she has been very sore and has a lot of fluid from the procedure and states she is suppose to get lymphatic massage but has not been getting to do this other than in [place].  She had a drain for 8 days. She is wearing a pressure garmet. She has been communicating with her physician in [place]. She has been on Bactrim and diclofenac with codeine.   She has several raised tender warm purpura on back, 4 total ranging from 5x8 inches. Denies sores in mouth, SOB, CP, nose bleeds.    Review of Systems  Constitutional: Negative.   HENT: Negative.   Respiratory: Negative.   Cardiovascular: Negative.   Gastrointestinal: Positive for abdominal pain and abdominal distention. Negative for nausea, vomiting, diarrhea, constipation, blood in stool and rectal pain.  Genitourinary: Negative.   Musculoskeletal: Positive for myalgias. Negative for arthralgias, back pain, gait problem, joint swelling, neck pain and neck stiffness.  Skin: Positive for color change and rash. Negative for pallor and wound.  Neurological: Negative.        Objective:   Physical Exam  Constitutional: She is oriented to person, place, and time. She appears well-developed and well-nourished. No distress.  Neck: Normal range of motion. Neck supple.  Cardiovascular: Normal rate and regular rhythm.   Pulmonary/Chest: Effort normal and breath sounds normal.  Abdominal: She exhibits distension and mass. There is tenderness in the right upper quadrant. There is rigidity and guarding.  Tender RUQ with very hard mass versus edema with several healing small incision marks, no discharge.   Musculoskeletal: Normal range of motion. She exhibits edema and tenderness.  Lymphadenopathy:    She has no cervical adenopathy.  Neurological: She  is alert and oriented to person, place, and time.  Skin: Skin is warm and dry.  raises tender warm purpura on back, 4 total ranging from 5x8 inches.      Assessment & Plan:  [place]gery in [place] with swelling, LUQ pain, and palpable purpura-  Unknown medications other than bactrim and codeine.  - rule out clotting R/O HSP, TTP get CBC, BMP, LFT, PT, PTT,  - refer for lymphatic massage - Norco 5/325 for pain # 60 NR  Patient instructed to go to the ER if any change, SOB, CP.  OVER 30 minutes of exam, counseling, chart review, referral performed

## 2014-02-21 ENCOUNTER — Emergency Department (HOSPITAL_COMMUNITY)
Admission: EM | Admit: 2014-02-21 | Discharge: 2014-02-21 | Disposition: A | Discharge status: Home / Self Care | Payer: BC Managed Care – PPO | Attending: Emergency Medicine | Admitting: Emergency Medicine

## 2014-02-21 ENCOUNTER — Telehealth: Payer: Self-pay | Admitting: Physician Assistant

## 2014-02-21 ENCOUNTER — Emergency Department (HOSPITAL_COMMUNITY): Payer: BC Managed Care – PPO

## 2014-02-21 ENCOUNTER — Encounter (HOSPITAL_COMMUNITY): Payer: Self-pay | Admitting: Emergency Medicine

## 2014-02-21 DIAGNOSIS — Z9889 Other specified postprocedural states: Secondary | ICD-10-CM

## 2014-02-21 DIAGNOSIS — R109 Unspecified abdominal pain: Secondary | ICD-10-CM

## 2014-02-21 DIAGNOSIS — R110 Nausea: Secondary | ICD-10-CM | POA: Insufficient documentation

## 2014-02-21 DIAGNOSIS — Z3202 Encounter for pregnancy test, result negative: Secondary | ICD-10-CM | POA: Insufficient documentation

## 2014-02-21 DIAGNOSIS — Y838 Other surgical procedures as the cause of abnormal reaction of the patient, or of later complication, without mention of misadventure at the time of the procedure: Secondary | ICD-10-CM | POA: Insufficient documentation

## 2014-02-21 DIAGNOSIS — Z79899 Other long term (current) drug therapy: Secondary | ICD-10-CM | POA: Insufficient documentation

## 2014-02-21 DIAGNOSIS — D649 Anemia, unspecified: Secondary | ICD-10-CM | POA: Insufficient documentation

## 2014-02-21 DIAGNOSIS — I10 Essential (primary) hypertension: Secondary | ICD-10-CM | POA: Insufficient documentation

## 2014-02-21 DIAGNOSIS — Z862 Personal history of diseases of the blood and blood-forming organs and certain disorders involving the immune mechanism: Secondary | ICD-10-CM | POA: Insufficient documentation

## 2014-02-21 DIAGNOSIS — R0789 Other chest pain: Secondary | ICD-10-CM | POA: Insufficient documentation

## 2014-02-21 DIAGNOSIS — IMO0002 Reserved for concepts with insufficient information to code with codable children: Secondary | ICD-10-CM | POA: Insufficient documentation

## 2014-02-21 DIAGNOSIS — Z8669 Personal history of other diseases of the nervous system and sense organs: Secondary | ICD-10-CM | POA: Insufficient documentation

## 2014-02-21 DIAGNOSIS — M549 Dorsalgia, unspecified: Secondary | ICD-10-CM | POA: Insufficient documentation

## 2014-02-21 DIAGNOSIS — Z8639 Personal history of other endocrine, nutritional and metabolic disease: Secondary | ICD-10-CM | POA: Insufficient documentation

## 2014-02-21 LAB — BASIC METABOLIC PANEL WITH GFR
BUN: 12 mg/dL (ref 6–23)
CO2: 25 meq/L (ref 19–32)
Calcium: 8.8 mg/dL (ref 8.4–10.5)
Chloride: 105 meq/L (ref 96–112)
Creatinine, Ser: 0.69 mg/dL (ref 0.50–1.10)
GFR calc Af Amer: >90 mL/min
GFR calc non Af Amer: >90 mL/min
Glucose, Bld: 83 mg/dL (ref 70–99)
Potassium: 4.1 meq/L (ref 3.7–5.3)
Sodium: 141 meq/L (ref 137–147)

## 2014-02-21 LAB — APTT
APTT: 30 s (ref 24–37)
aPTT: 30 seconds (ref 24–37)

## 2014-02-21 LAB — URINALYSIS, ROUTINE W REFLEX MICROSCOPIC
Bilirubin Urine: NEGATIVE
GLUCOSE, UA: NEGATIVE mg/dL
Hgb urine dipstick: NEGATIVE
KETONES UR: NEGATIVE mg/dL
LEUKOCYTES UA: NEGATIVE
NITRITE: NEGATIVE
PROTEIN: NEGATIVE mg/dL
Specific Gravity, Urine: 1.027 (ref 1.005–1.030)
UROBILINOGEN UA: 0.2 mg/dL (ref 0.0–1.0)
pH: 6.5 (ref 5.0–8.0)

## 2014-02-21 LAB — CBC
HEMATOCRIT: 24.4 % — AB (ref 36.0–46.0)
HEMOGLOBIN: 8.1 g/dL — AB (ref 12.0–15.0)
MCH: 27.4 pg (ref 26.0–34.0)
MCHC: 33.2 g/dL (ref 30.0–36.0)
MCV: 82.4 fL (ref 78.0–100.0)
Platelets: 336 10*3/uL (ref 150–400)
RBC: 2.96 MIL/uL — AB (ref 3.87–5.11)
RDW: 14.5 % (ref 11.5–15.5)
WBC: 7.2 10*3/uL (ref 4.0–10.5)

## 2014-02-21 LAB — PROTIME-INR
INR: 0.97
INR: 1.06 (ref 0.00–1.49)
Prothrombin Time: 12.8 s (ref 11.6–15.2)
Prothrombin Time: 13.6 seconds (ref 11.6–15.2)

## 2014-02-21 LAB — PREGNANCY, URINE: Preg Test, Ur: NEGATIVE

## 2014-02-21 MED ORDER — IOHEXOL 300 MG/ML  SOLN
25.0000 mL | INTRAMUSCULAR | Status: AC
  Administered 2014-02-21 (×2): 25 mL via ORAL

## 2014-02-21 MED ORDER — IOHEXOL 300 MG/ML  SOLN
100.0000 mL | Freq: Once | INTRAMUSCULAR | Status: AC | PRN
  Administered 2014-02-21: 100 mL via INTRAVENOUS

## 2014-02-21 MED ORDER — ONDANSETRON HCL 4 MG/2ML IJ SOLN
4.0000 mg | Freq: Once | INTRAMUSCULAR | Status: AC
  Administered 2014-02-21: 4 mg via INTRAVENOUS
  Filled 2014-02-21: qty 2

## 2014-02-21 MED ORDER — MORPHINE SULFATE 4 MG/ML IJ SOLN
4.0000 mg | Freq: Once | INTRAMUSCULAR | Status: AC
  Administered 2014-02-21: 4 mg via INTRAVENOUS
  Filled 2014-02-21: qty 1

## 2014-02-21 NOTE — ED Notes (Signed)
Pt had liposuction and butt lift 2 weeks ago.  Pt is reporting large bruises on her back and has swelling in abdomen.  Pt had some lab work drawn yesterday.  Pt was told to come to the ED because her hemoglobin level was low

## 2014-02-21 NOTE — Telephone Encounter (Signed)
Patient is now 13 days s/p procedure in [place]. She presented yesterday with AB swelling, pain and purpura. Her H/H has dropped from 11.6 to 7.7, she is not have symptoms of anemia such as dizziness, CP, SOB however with the swelling I have asked her to go to the ER to get a stat CBC to see if there is a further drop of H/H and if she needs a transfusion versus referral to hematology. Her WBC is normal, protein slightly decreased but LFTs and kidney function stable.

## 2014-02-21 NOTE — ED Provider Notes (Signed)
Medical screening examination/treatment/procedure(s) were conducted as a shared visit with non-physician practitioner(s) and myself.  I personally evaluated the patient during the encounter.   EKG Interpretation None       Results for orders placed during the hospital encounter of 02/21/14  CBC      Result Value Ref Range   WBC 7.2  4.0 - 10.5 K/uL   RBC 2.96 (*) 3.87 - 5.11 MIL/uL   Hemoglobin 8.1 (*) 12.0 - 15.0 g/dL   HCT 16.124.4 (*) 09.636.0 - 04.546.0 %   MCV 82.4  78.0 - 100.0 fL   MCH 27.4  26.0 - 34.0 pg   MCHC 33.2  30.0 - 36.0 g/dL   RDW 40.914.5  81.111.5 - 91.415.5 %   Platelets 336  150 - 400 K/uL  BASIC METABOLIC PANEL      Result Value Ref Range   Sodium 141  137 - 147 mEq/L   Potassium 4.1  3.7 - 5.3 mEq/L   Chloride 105  96 - 112 mEq/L   CO2 25  19 - 32 mEq/L   Glucose, Bld 83  70 - 99 mg/dL   BUN 12  6 - 23 mg/dL   Creatinine, Ser 7.820.69  0.50 - 1.10 mg/dL   Calcium 8.8  8.4 - 95.610.5 mg/dL   GFR calc non Af Amer >90  >90 mL/min   GFR calc Af Amer >90  >90 mL/min  PREGNANCY, URINE      Result Value Ref Range   Preg Test, Ur NEGATIVE  NEGATIVE  APTT      Result Value Ref Range   aPTT 30  24 - 37 seconds  PROTIME-INR      Result Value Ref Range   Prothrombin Time 13.6  11.6 - 15.2 seconds   INR 1.06  0.00 - 1.49  URINALYSIS, ROUTINE W REFLEX MICROSCOPIC      Result Value Ref Range   Color, Urine YELLOW  YELLOW   APPearance CLEAR  CLEAR   Specific Gravity, Urine 1.027  1.005 - 1.030   pH 6.5  5.0 - 8.0   Glucose, UA NEGATIVE  NEGATIVE mg/dL   Hgb urine dipstick NEGATIVE  NEGATIVE   Bilirubin Urine NEGATIVE  NEGATIVE   Ketones, ur NEGATIVE  NEGATIVE mg/dL   Protein, ur NEGATIVE  NEGATIVE mg/dL   Urobilinogen, UA 0.2  0.0 - 1.0 mg/dL   Nitrite NEGATIVE  NEGATIVE   Leukocytes, UA NEGATIVE  NEGATIVE     Patient seen by me. Patient 2 weeks ago had plastic surgery liposuction of the left done in the [place]. Patient referred in by primary care Dr. that labs  yesterday and had a hemoglobin of 7.7. Patient also had concerns for some skin lesions and skin peeling on her back and some hardness and fullness in her abdomen to the upper part of her abdomen. CT scan here shows no significant fluid collections. No leukocytosis. Hemoglobin here is 8.1 marginal but still above 8. Patient will be discharged back to followup with her primary care Dr. Geronimo RunningShe's been sending pictures of the skin lesions on the back to her plastic surgeon in [place]. We have requested general surgery consult to take a look at the wounds they refused. However patient most likely can followup with primary care doctor and continue to tell what type followup with her plastic surgeon. Patient will return for any newer worse symptoms. Patient instructed to have her hemoglobin and hematocrit checked again in 2 days. And continuous and  progressive skin lesions on the back as she has been doing.  Shelda Jakes, MD 02/21/14 [phone]

## 2014-02-21 NOTE — ED Notes (Signed)
1st Omnipaque ingested.

## 2014-02-21 NOTE — ED Notes (Signed)
2nd Omnipaque ingested.

## 2014-02-21 NOTE — ED Provider Notes (Signed)
CSN: [id number]     Arrival date & time 02/21/14  1111 History   First MD Initiated Contact with Patient 02/21/14 1125     Chief Complaint  Patient presents with  . Post surgical comp      (Consider location/radiation/quality/duration/timing/severity/associated sxs/prior Treatment) The history is provided by the patient. No language interpreter was used.  Tamara May is a 23 year old female with past medical history of headache, seizure, hypertension presenting to the ED with abdominal pain, abnormal bruising, swelling after liposuction and the left was performed approximately 2 weeks ago and [place]. Patient reported that she's noticed increased in swelling in her abdomen to the point where she is unable to wear her spandex garment-reported that she called the [place] physician who recommended her to only with stage I consider stage II. Reported that on Saturday night she noticed increased swelling to her abdomen and chest. Stated that the abdominal pain is described as a burning sharp pain with soreness to the touch. Stated that she has noticed increased distention of her abdomen-reported that she has fluid build up. Stated that she's been feeling nauseous. Reported that she's noticed these lesions/bruises on her back that occurred 2-3 days after surgery-reported that these are non-normal presents since they are not healing like normal bruising. Reported that they have been peeling. Stated that she went to her primary care provider yesterday since she was not feeling that great-reported weakness and fatigue. Reported that labs have been drawn, reported that she received a phone call this morning is identified hemoglobin was low at 7.7. Stated that she was instructed to come to the ED. Patient reported that she recently started her menstrual cycle on Friday. Denied active drainage, fever, chills, changes to bowel movements, decreased flatulence, urinary issues, chest pain,  shortness of breath, difficulty breathing, melena, hematochezia. PCP Dr. Oneta Rack  Past Medical History  Diagnosis Date  . Headache(784.0)   . Seizures     as a child  . Anemia   . Prediabetes   . B12 deficiency   . Hypertension    Past Surgical History  Procedure Laterality Date  . Wisdom tooth extraction    . Mouth surgery    . No past surgeries    . Cosmetic surgery      Liposuction, buttock surgery   Family History  Problem Relation Age of Onset  . Anesthesia problems Neg Hx   . Hypertension Mother   . Cancer Mother     breast x2  . Asthma Mother   . Hypertension Father   . Gout Father   . Diabetes Maternal Grandmother   . Vision loss Maternal Grandmother     cataracts  . Stroke Maternal Grandmother   . Diabetes Paternal Grandmother   . Alcohol abuse Paternal Grandmother   . Vision loss Maternal Uncle    History  Substance Use Topics  . Smoking status: Never Smoker   . Smokeless tobacco: Not on file  . Alcohol Use: No   OB History   Grav Para Term Preterm Abortions TAB SAB Ect Mult Living   3 1 1  2 1 1   1      Review of Systems  Constitutional: Negative for fever and chills.  HENT: Negative for trouble swallowing.   Respiratory: Positive for chest tightness. Negative for shortness of breath.   Cardiovascular: Negative for chest pain.  Gastrointestinal: Positive for nausea and abdominal pain. Negative for vomiting, diarrhea, constipation, blood in stool and anal bleeding.  Genitourinary:  Positive for flank pain and vaginal bleeding (currently menstruating]). Negative for vaginal pain.  Musculoskeletal: Positive for back pain. Negative for neck pain.  Neurological: Negative for dizziness and weakness.  All other systems reviewed and are negative.      Allergies  Review of patient's allergies indicates no known allergies.  Home Medications   Current Outpatient Rx  Name  Route  Sig  Dispense  Refill  . Cholecalciferol (VITAMIN D-3) 1000 UNITS  CAPS   Oral   Take 1,000 Units by mouth daily.          . Prenatal Vit-Fe Fumarate-FA (PRENATAL MULTIVITAMIN) TABS   Oral   Take 1 tablet by mouth 3 (three) times a week. No specific days         . HYDROcodone-acetaminophen (NORCO) 5-325 MG per tablet   Oral   Take 1 tablet by mouth 2 (two) times daily as needed for moderate pain.   60 tablet   0    BP 118/64  Pulse 84  Temp(Src) 98.7 F (37.1 C) (Oral)  Resp 19  Wt 173 lb (78.472 kg)  SpO2 98%  LMP 02/17/2014 Physical Exam  Nursing note and vitals reviewed. Constitutional: She is oriented to person, place, and time. She appears well-developed and well-nourished. No distress.  HENT:  Head: Normocephalic and atraumatic.  Mouth/Throat: Oropharynx is clear and moist. No oropharyngeal exudate.  Eyes: Conjunctivae and EOM are normal. Pupils are equal, round, and reactive to light. Right eye exhibits no discharge. Left eye exhibits no discharge.  Neck: Normal range of motion. Neck supple. No tracheal deviation present.  Cardiovascular: Normal rate, regular rhythm and normal heart sounds.  Exam reveals no friction rub.   No murmur heard. Pulses:      Radial pulses are 2+ on the right side, and 2+ on the left side.       Dorsalis pedis pulses are 2+ on the right side, and 2+ on the left side.  Pulmonary/Chest: Effort normal and breath sounds normal. No respiratory distress. She has no wheezes. She has no rales. She exhibits no tenderness.    Hard are of palpation between the breast with negative erythema, inflammation, lesions, sores noted. Negative warmth upon palpation.  Negative use of accessory muscles Patient stable to speak in full sentences without difficulty Negative stridor  Abdominal: Bowel sounds are normal. There is tenderness. There is no guarding.  Abdomen hard upon palapation - entire abdomen  Musculoskeletal: Normal range of motion.  Full ROM to upper and lower extremities without difficulty noted, negative  ataxia noted.  Lymphadenopathy:    She has no cervical adenopathy.  Neurological: She is alert and oriented to person, place, and time. No cranial nerve deficit. She exhibits normal muscle tone. Coordination normal.  Skin: She is not diaphoretic. No erythema.     Incision sites appear well - negative swelling, erythema, inflammation, drainage, bleeding - negative signs of infection.   Medium sized superficial bruising lesions localized to the left side of the abdomen, flank region with negative pain upon palpation. Area is peeling.   Psychiatric: She has a normal mood and affect. Her behavior is normal. Thought content normal.    ED Course  Procedures (including critical care time)  5:12 PM This provider spoke with Dr. Magnus IvanBlackman, general surgeon - discussed case, history, presentation, labs, and imaging in great detail. This provider asked physician to see and assess. Physician refused - reported that this situation is a plastic surgery situation and needs to be seen  by plastic. No plastics are on-all in this hospital. General surgery refused to see patient.   5:22 PM Dr. Deretha Emory in room assessing patient.  As per physician, cleared patient for discharge. Reported that patient can be discharged - reported that patient has close follow-up with PCP and is texting/sending photos to surgeon in [place]. Patient has close contact with physicians. Recommended patient to be discharged with Hgb and Hct to be repeated within the next couple of days.   Results for orders placed during the hospital encounter of 02/21/14  CBC      Result Value Ref Range   WBC 7.2  4.0 - 10.5 K/uL   RBC 2.96 (*) 3.87 - 5.11 MIL/uL   Hemoglobin 8.1 (*) 12.0 - 15.0 g/dL   HCT 16.1 (*) 09.6 - 04.5 %   MCV 82.4  78.0 - 100.0 fL   MCH 27.4  26.0 - 34.0 pg   MCHC 33.2  30.0 - 36.0 g/dL   RDW 40.9  81.1 - 91.4 %   Platelets 336  150 - 400 K/uL  BASIC METABOLIC PANEL      Result Value Ref Range   Sodium 141  137 - 147 mEq/L    Potassium 4.1  3.7 - 5.3 mEq/L   Chloride 105  96 - 112 mEq/L   CO2 25  19 - 32 mEq/L   Glucose, Bld 83  70 - 99 mg/dL   BUN 12  6 - 23 mg/dL   Creatinine, Ser 7.82  0.50 - 1.10 mg/dL   Calcium 8.8  8.4 - 95.6 mg/dL   GFR calc non Af Amer >90  >90 mL/min   GFR calc Af Amer >90  >90 mL/min  PREGNANCY, URINE      Result Value Ref Range   Preg Test, Ur NEGATIVE  NEGATIVE  APTT      Result Value Ref Range   aPTT 30  24 - 37 seconds  PROTIME-INR      Result Value Ref Range   Prothrombin Time 13.6  11.6 - 15.2 seconds   INR 1.06  0.00 - 1.49  URINALYSIS, ROUTINE W REFLEX MICROSCOPIC      Result Value Ref Range   Color, Urine YELLOW  YELLOW   APPearance CLEAR  CLEAR   Specific Gravity, Urine 1.027  1.005 - 1.030   pH 6.5  5.0 - 8.0   Glucose, UA NEGATIVE  NEGATIVE mg/dL   Hgb urine dipstick NEGATIVE  NEGATIVE   Bilirubin Urine NEGATIVE  NEGATIVE   Ketones, ur NEGATIVE  NEGATIVE mg/dL   Protein, ur NEGATIVE  NEGATIVE mg/dL   Urobilinogen, UA 0.2  0.0 - 1.0 mg/dL   Nitrite NEGATIVE  NEGATIVE   Leukocytes, UA NEGATIVE  NEGATIVE    Labs Review Labs Reviewed  CBC - Abnormal; Notable for the following:    RBC 2.96 (*)    Hemoglobin 8.1 (*)    HCT 24.4 (*)    All other components within normal limits  BASIC METABOLIC PANEL  PREGNANCY, URINE  APTT  PROTIME-INR  URINALYSIS, ROUTINE W REFLEX MICROSCOPIC   Imaging Review Dg Chest 2 View  02/21/2014   CLINICAL DATA:  Fluid retention  EXAM: CHEST  2 VIEW  COMPARISON:  Chest x-ray of 05/29/2010  FINDINGS: No active infiltrate or effusion is seen. Mediastinal contours are stable and the heart is within normal limits in size. No bony abnormality is noted.  IMPRESSION: No active cardiopulmonary disease.   Electronically Signed   By: Renae Fickle  Gery Pray M.D.   On: 02/21/2014 14:02   Ct Abdomen Pelvis W Contrast  02/21/2014   CLINICAL DATA:  Swelling following liposuction  EXAM: CT ABDOMEN AND PELVIS WITH CONTRAST  TECHNIQUE: Multidetector CT  imaging of the abdomen and pelvis was performed using the standard protocol following bolus administration of intravenous contrast.  CONTRAST:  OMNIPAQUE IOHEXOL 300 MG/ML  SOLN  COMPARISON:  None.  FINDINGS: Lung bases are clear.  Liver, spleen, pancreas, and adrenal glands are within normal limits.  Gallbladder is unremarkable. No intrahepatic or extrahepatic ductal dilatation.  Kidneys are within normal limits.  No hydronephrosis.  No evidence of bowel obstruction.  Normal appendix.  No evidence of abdominal aortic aneurysm.  Trace pelvic ascites.  No suspicious abdominopelvic lymphadenopathy.  Uterus and bilateral ovaries are unremarkable.  Bladder is within normal limits.  Subcutaneous stranding/fluid along the anterior abdominal wall and lower back (for example, series 2/image 28), likely related to recent cosmetic surgery. No drainable fluid collection/abscess.  Visualized osseous structures are within normal limits.  IMPRESSION: Subcutaneous stranding/fluid along the anterior abdominal wall and lower back, likely related to recent cosmetic surgery.  No drainable fluid collection/abscess.   Electronically Signed   By: Charline Bills M.D.   On: 02/21/2014 16:52     EKG Interpretation None      MDM   Final diagnoses:  Abdominal pain  S/P plastic surgery  Low hemoglobin   Medications  iohexol (OMNIPAQUE) 300 MG/ML solution 25 mL (25 mLs Oral Contrast Given 02/21/14 1534)  morphine 4 MG/ML injection 4 mg (4 mg Intravenous Given 02/21/14 1537)  ondansetron (ZOFRAN) injection 4 mg (4 mg Intravenous Given 02/21/14 1538)  iohexol (OMNIPAQUE) 300 MG/ML solution 100 mL (100 mLs Intravenous Contrast Given 02/21/14 1627)   Filed Vitals:   02/21/14 1315 02/21/14 1415 02/21/14 1600 02/21/14 1642  BP: 101/67 104/73 121/73 118/64  Pulse: 73 96 82 84  Temp:      TempSrc:      Resp:    19  Weight:      SpO2: 100% 100% 98% 98%    Patient presenting to the ED after liposuction but with that  occurred 2 weeks ago while in the [place]-reported, swelling, abdominal pain described as a burning sensation that is constant, lesions or peeling. Patient reported that she's very uncomfortable. Reported that she had labs drawn at her primary care provider yesterday for hemoglobin was well. Bicitra to come to the ED. This provider reviewed the patient's chart. Patient was seen and assessed at her PCP office yesterday by the PA - blood obtained. Hgb dropped from 11.6 to 7.7 within one month time span - in that time patient has had liposuction and butt lift, along with starting menstrual cycle this Friday. Alert and oriented. GCS 15. Heart rate and rhythm normal. Lungs good auscultation to upper and lower lobes bilaterally. Bowel sounds normal active in all 4 quadrants-hard upon palpation. Hard area between breast noted. Incision sites without signs of infection. Radial and DP pulses 2+. Lesions on the left flank appear to be superificial with peeling noted with negative pain upon palpation - negative active bleeding. Full ROM to upper and lower extremities without difficulty noted, negative ataxia noted.  This provider reviewed patient's chart. Patient was seen and assessed in her primary care provider's office yesterday were plus are obtained. Hemoglobin of 7.7 identified yesterday. Remaining labs normal. When previous labs reviewed-patient continuously has anemia with low hemoglobin levels ranging anywhere from 11.8 to 10.2 to  8.7. CBC negative elevated white blood cell count, hemoglobin 8.1-hemoglobin has improved. BNP negative findings. PT, INR within normal limits. APTT negative findings. Urinalysis negative for infection-negative nitrates or leukocytes identified. Urine pregnancy negative. Chest x-ray negative acute cardia pulmonary disease noted. CT abdomen and pelvis with contrast noted subcutaneous stranding/fluid along the anterior abdominal wall and lower back likely related to recent  cosmetic surgery-no drainable fluid collection or abscess. Negative acute abscess or drainable fluid noted to the CT scan. Appears to be purpura to the left flank region - negative active drainage, bleeding or infection noted - appears to be from surgery. Negative leukocytosis noted. APTT and INR - negative findings. Hgb has improved from 7.7 to 8.1 - no transfusion needed at this time. Negative active bleeding noted. Patient stable, afebrile. Patient not septic appearing. Patient seen and assessed by attending physician who cleared patient for discharge. Discharged patient. Referred patient to PCP - discussed with patient that she will need to get Hgb and Hct repeated within the next couple of days and to ask for a plastic surgery referral from her PCP. Discussed with patient to continue to keep in contact with her surgeon. Discussed with patient to rest and stay hydrated. Discussed with patient to avoid any physical or strenuous activity. Discussed with patient to closely monitor symptoms and if symptoms are to worsen or change to report back to the ED - strict return instructions given.  Patient agreed to plan of care, understood, all questions answered.   Raymon Mutton, PA-C 02/21/14 2325

## 2014-02-21 NOTE — ED Notes (Signed)
CT called pt finished with Contrast.

## 2014-02-21 NOTE — Discharge Instructions (Signed)
Please call your doctor for a followup appointment within 24-48 hours. When you talk to your doctor please let them know that you were seen in the emergency department and have them acquire all of your records so that they can discuss the findings with you and formulate a treatment plan to fully care for your new and ongoing problems. Please call and set-up an appointment with your primary doctor to be re-assessed within 24-48 hours - will need to get Hemoglobin and Hematocrit to be re-checked  Please keep in contact with plastic surgeon to keep physician up to date Please ask your primary provider to be referred to plastic surgery in the area to have follow-up started Please rest and stay hydrated Please continue to take medications as prescribed at home Please avoid any heavy lifting or strenuous activity Please continue to monitor symptoms and if symptoms are to worsen or change (fever greater than 101, chills, sweating, nausea, vomiting, diarrhea, blood in the stool, black tarry stools, numbness, tingling, chest pain, shortness of breath, difficulty breathing, changes to incision sites, changes to lesions on your back, abdominal distension) please report back to the ED immediately   Abdominal Pain, Women Abdominal (stomach, pelvic, or belly) pain can be caused by many things. It is important to tell your doctor:  The location of the pain.  Does it come and go or is it present all the time?  Are there things that start the pain (eating certain foods, exercise)?  Are there other symptoms associated with the pain (fever, nausea, vomiting, diarrhea)? All of this is helpful to know when trying to find the cause of the pain. CAUSES   Stomach: virus or bacteria infection, or ulcer.  Intestine: appendicitis (inflamed appendix), regional ileitis (Crohn's disease), ulcerative colitis (inflamed colon), irritable bowel syndrome, diverticulitis (inflamed diverticulum of the colon), or cancer of the  stomach or intestine.  Gallbladder disease or stones in the gallbladder.  Kidney disease, kidney stones, or infection.  Pancreas infection or cancer.  Fibromyalgia (pain disorder).  Diseases of the female organs:  Uterus: fibroid (non-cancerous) tumors or infection.  Fallopian tubes: infection or tubal pregnancy.  Ovary: cysts or tumors.  Pelvic adhesions (scar tissue).  Endometriosis (uterus lining tissue growing in the pelvis and on the pelvic organs).  Pelvic congestion syndrome (female organs filling up with blood just before the menstrual period).  Pain with the menstrual period.  Pain with ovulation (producing an egg).  Pain with an IUD (intrauterine device, birth control) in the uterus.  Cancer of the female organs.  Functional pain (pain not caused by a disease, may improve without treatment).  Psychological pain.  Depression. DIAGNOSIS  Your doctor will decide the seriousness of your pain by doing an examination.  Blood tests.  X-rays.  Ultrasound.  CT scan (computed tomography, special type of X-ray).  MRI (magnetic resonance imaging).  Cultures, for infection.  Barium enema (dye inserted in the large intestine, to better view it with X-rays).  Colonoscopy (looking in intestine with a lighted tube).  Laparoscopy (minor surgery, looking in abdomen with a lighted tube).  Major abdominal exploratory surgery (looking in abdomen with a large incision). TREATMENT  The treatment will depend on the cause of the pain.   Many cases can be observed and treated at home.  Over-the-counter medicines recommended by your caregiver.  Prescription medicine.  Antibiotics, for infection.  Birth control pills, for painful periods or for ovulation pain.  Hormone treatment, for endometriosis.  Nerve blocking injections.  Physical  therapy.  Antidepressants.  Counseling with a psychologist or psychiatrist.  Minor or major surgery. HOME CARE  INSTRUCTIONS   Do not take laxatives, unless directed by your caregiver.  Take over-the-counter pain medicine only if ordered by your caregiver. Do not take aspirin because it can cause an upset stomach or bleeding.  Try a clear liquid diet (broth or water) as ordered by your caregiver. Slowly move to a bland diet, as tolerated, if the pain is related to the stomach or intestine.  Have a thermometer and take your temperature several times a day, and record it.  Bed rest and sleep, if it helps the pain.  Avoid sexual intercourse, if it causes pain.  Avoid stressful situations.  Keep your follow-up appointments and tests, as your caregiver orders.  If the pain does not go away with medicine or surgery, you may try:  Acupuncture.  Relaxation exercises (yoga, meditation).  Group therapy.  Counseling. SEEK MEDICAL CARE IF:   You notice certain foods cause stomach pain.  Your home care treatment is not helping your pain.  You need stronger pain medicine.  You want your IUD removed.  You feel faint or lightheaded.  You develop nausea and vomiting.  You develop a rash.  You are having side effects or an allergy to your medicine. SEEK IMMEDIATE MEDICAL CARE IF:   Your pain does not go away or gets worse.  You have a fever.  Your pain is felt only in portions of the abdomen. The right side could possibly be appendicitis. The left lower portion of the abdomen could be colitis or diverticulitis.  You are passing blood in your stools (bright red or black tarry stools, with or without vomiting).  You have blood in your urine.  You develop chills, with or without a fever.  You pass out. MAKE SURE YOU:   Understand these instructions.  Will watch your condition.  Will get help right away if you are not doing well or get worse. Document Released: 09/21/2007 Document Revised: 02/16/2012 Document Reviewed: 10/11/2009 [facility] Patient Information 2014 [place],  [place].   Emergency Department Resource Guide 1) Find a Doctor and Pay Out of Pocket Although you won't have to find out who is covered by your insurance plan, it is a good idea to ask around and get recommendations. You will then need to call the office and see if the doctor you have chosen will accept you as a new patient and what types of options they offer for patients who are self-pay. Some doctors offer discounts or will set up payment plans for their patients who do not have insurance, but you will need to ask so you aren't surprised when you get to your appointment.  2) Contact Your Local Health Department Not all health departments have doctors that can see patients for sick visits, but many do, so it is worth a call to see if yours does. If you don't know where your local health department is, you can check in your phone book. The CDC also has a tool to help you locate your state's health department, and many state websites also have listings of all of their local health departments.  3) Find a Walk-in Clinic If your illness is not likely to be very severe or complicated, you may want to try a walk in clinic. These are popping up all over the country in pharmacies, drugstores, and shopping centers. They're usually staffed by nurse practitioners or physician assistants that have been trained  to treat common illnesses and complaints. They're usually fairly quick and inexpensive. However, if you have serious medical issues or chronic medical problems, these are probably not your best option.  No Primary Care Doctor: - Call Health Connect at  [phone] - they can help you locate a primary care doctor that  accepts your insurance, provides certain services, etc. - Physician Referral Service- [phone]  Chronic Pain Problems: Organization         Address  Phone   Notes  [facility]  [phone] Patients need to be referred by their primary care doctor.   Medication  Assistance: Organization         Address  Phone   Notes  [facility] Medication [facility] [address] [place], [place] [address] [phone] --Must be a resident of [facility] [place] -- Must have NO insurance coverage whatsoever (no Medicaid/ Medicare, etc.) -- The pt. MUST have a primary care doctor that directs their care regularly and follows them in the community   MedAssist  [phone]   [employer]  [phone]    Agencies that provide inexpensive medical care: Organization         Address  Phone   Notes  [facility]  [phone]   [facility]    [phone]) [phone]   [facility] [address] [place], [place] [address] [phone]   [facility] [address], [place] [phone]   Planned Parenthood    [phone]   [facility]    [phone]   [facility]  [address], [place] Phone:  [phone], Fax:  [phone] Hours of Operation:  9 am - 6 pm, M-F.  Also accepts Medicaid/Medicare and self-pay.  [facility]  [address], [place] Phone: [phone], Fax: [phone]. Hours of Operation:  8:30 am - 5:30 pm, M-F.  Also accepts Medicaid and self-pay.  [facility] [place] [address], [place] Phone: [phone]   [facility] [address], [place], [place] [phone], Ext. [phone] Mondays & Thursdays: 7-9 AM.  First 15 patients are seen on a first come, first serve basis.    Medicaid-accepting [facility] Providers:  Organization         Address  Phone   Notes  [facility] [address], [place] [phone] Also accepts self-pay patients.  [facility] [address], [place]  [phone]   [facility] [address], [place]  [phone]) [phone]   [facility] [address] [place] [address], [place] [phone]   Renaye RakersVeita Bland [address], [place]   [phone] Only accepts [place] Access [place] patients after they have their name applied to their card.   Self-Pay (no insurance) in [facility]:  Organization         Address  Phone   Notes  Sickle Cell Patients, [facility] [address], [place] [phone]) [phone]   [facility] [address], [place] [phone]) [phone]   [facility] [place]  [address], [place] [phone]   [facility]/Dr. Osei-Bonsu  [address] [place] [address], [place] or [phone], [address], [place] [phone] Phone number for both [facility] and [place] locations is the same.  [facility]gent [facility] and [facility] [address], [place] [phone]   [facility] [address] [place] or [address] [phone]   [facility] [address], [place] [phone], phone; [phone], fax Sees patients 1st and 3rd Saturday of every month.  Must not qualify for public or private insurance (i.e. Medicaid, Medicare, [place] Health Choice, Veterans' Benefits)  Household income should be no more than 200% of the poverty level The clinic cannot treat you if you are pregnant or think you are pregnant  Sexually transmitted diseases are not treated at the clinic.    Dental Care: Organization         Address  Phone  Notes  [facility] [address] [place], [place] [phone] Accepts children up to age 8 who are enrolled in [place] or [place] Health Choice; pregnant women with a Medicaid card; and children who have applied for Medicaid or [place] Health Choice, but were declined, whose parents can pay a reduced fee at time of service.  [facility]  Department of [facility]  [address], [place] [phone] Accepts children up to age 72 who are enrolled in [place] or [place] Health Choice; pregnant women with a Medicaid card; and children who have applied for Medicaid or [place] Health Choice, but were declined, whose parents can pay a reduced fee at time of service.  [facility]  [address] [place], [place] [phone] Patients are seen by appointment only. Walk-ins are not accepted. [facility] will see patients 46 years of age and older. Monday - Tuesday (8am-5pm) Most Wednesdays (8:30-5pm) $30 per visit, cash only  [facility] Adult Dental Access PROGRAM  [address], [facility] [phone] Patients are seen by appointment only. Walk-ins are not accepted. [facility] will see patients 36 years of age and older. One Wednesday Evening (Monthly: Volunteer Based).  $30 per visit, cash only  [employer]  [phone] for adults; Children under age 1, call Graduate Pediatric Dentistry at [phone]. Children aged 29-14, please call [phone] to request a pediatric application.  Dental services are provided in all areas of dental care including fillings, crowns and bridges, complete and partial dentures, implants, gum treatment, root canals, and extractions. Preventive care is also provided. Treatment is provided to both adults and children. Patients are selected via a lottery and there is often a waiting list.   [facility] [address] [place]  [phone] [email or web address]   [facility] [address] [place], [place] [phone], Ext. [phone] Second and Fourth Thursday of each month, opens at 6:30 AM; Clinic ends at 9 AM.  Patients are seen on a first-come first-served basis, and a limited number are seen during each clinic.   [facility]  [address] Ether [place], [place] [phone]    Eligibility Requirements You must have lived in [place], [place], or [place] counties for at least the last three months.   You cannot be eligible for state or federal sponsored [place], including [employer], [place], or [employer].   You generally cannot be eligible for healthcare insurance through your employer.    How to apply: Eligibility screenings are held  every Tuesday and Wednesday afternoon from 1:00 pm until 4:00 pm. You do not need an appointment for the interview!  [facility] [address], [place], [place] [phone]   [facility]  [phone]   [facility]  [phone]   [facility]  [phone]    Behavioral Health Resources in the Community: Intensive Outpatient Programs Organization         Address  Phone  Notes  [facility] [address], [place], [place] [phone]   [facility] [address], [place], [place] [phone]   [facility] [address], [place], [place]  [phone]   [facility] [address],  [place], [place] [phone] or [phone]   Substance Abuse Resources Organization         Address  Phone  Notes  [facility]  [phone]   [facility]  [phone]   [facility]  [phone]   [facility]  [phone]   Residential & Outpatient Substance Abuse Program  [phone]   Psychological Services Organization         Address  Phone  Notes  [facility]  [phone]- [phone]   [facility]  [phone]- [phone]   [facility] [address], [place] [phone] or [phone]    Mobile Crisis Teams Organization         Address  Phone  Notes  [facility], Mobile Crisis Care Unit  [phone]   [facility]  [address]  [place], [place] [phone]   Doristine LocksSharon DeEsch [address] [place] [phone]    Self-Help/Support Groups Organization         Address  Phone             Notes  [facility] [place] - variety of support groups  [phone] Call for more information  Narcotics Anonymous (NA), [facility] [address], [place]  2 meetings at this location   Statisticianesidential Treatment Programs Organization         Address  Phone  Notes  [facility] [address],    [place]  [phone]   [facility]  [address] [place] [address], [place] [phone], [place], [place] [phone]   [facility] [address], [place] [phone] Admissions: 8am-3pm M-F  [facility] [address]    [place], [place] [phone]   [facility] [address], [place], [place] [phone]   [facility] [address]  [place], [place] [phone]   [facility] - Intensive Outpatient [address], [place], [place] [phone]   [facility] ([facility] [address]  [place], [place] [phone] or [phone]   [facility] ([facility]) [address] [place], [place] [phone] Accepts Medicaid  [facility] [address]  [place] [phone] Substance Abuse/Addiction Treatment   [facility] Resources Organization         Address  Phone  Notes  [facility]  [phone]) [phone]   Angie FavaJulie Brannon, PhD [address], [address] [place], [place]   [phone](336) [phone] or [phone]   [facility]   [address] [place], [place] [phone]   [facility] [address], [place], [place] [phone]) [phone] Insurance/Medicaid/sponsorship through [facility] Families [address]                                    [place], [place] [phone] Therapy/tele-psych/case    [facility] [address]   [place], [place] [phone]    Dr. Lolly Mustache  [phone]   [facility]  [employer] [facility] Dept. 1) [address], [place] 2) [address], [place] 3)  [address], [place] [phone]  [phone]   [facility] Child Abuse Hotline [phone])812-0409 or [phone] (After Hours)

## 2014-02-21 NOTE — ED Notes (Signed)
PA at bedside.

## 2014-10-09 ENCOUNTER — Encounter (HOSPITAL_COMMUNITY): Payer: Self-pay | Admitting: Emergency Medicine

## 2016-07-24 ENCOUNTER — Other Ambulatory Visit (HOSPITAL_COMMUNITY)
Admission: RE | Admit: 2016-07-24 | Discharge: 2016-07-24 | Disposition: A | Discharge status: Home / Self Care | Payer: BC Managed Care – PPO | Source: Ambulatory Visit | Attending: Family Medicine | Admitting: Family Medicine

## 2016-07-24 ENCOUNTER — Other Ambulatory Visit: Payer: Self-pay | Admitting: Family Medicine

## 2016-07-24 DIAGNOSIS — R87810 Cervical high risk human papillomavirus (HPV) DNA test positive: Secondary | ICD-10-CM | POA: Insufficient documentation

## 2016-07-24 DIAGNOSIS — Z1151 Encounter for screening for human papillomavirus (HPV): Secondary | ICD-10-CM | POA: Diagnosis not present

## 2016-07-24 DIAGNOSIS — N644 Mastodynia: Secondary | ICD-10-CM

## 2016-07-24 DIAGNOSIS — Z01411 Encounter for gynecological examination (general) (routine) with abnormal findings: Secondary | ICD-10-CM | POA: Insufficient documentation

## 2016-07-24 DIAGNOSIS — Z803 Family history of malignant neoplasm of breast: Secondary | ICD-10-CM

## 2016-07-24 DIAGNOSIS — Z113 Encounter for screening for infections with a predominantly sexual mode of transmission: Secondary | ICD-10-CM | POA: Insufficient documentation

## 2016-07-29 ENCOUNTER — Other Ambulatory Visit: Payer: BC Managed Care – PPO

## 2016-07-29 LAB — CYTOLOGY - PAP

## 2016-08-06 ENCOUNTER — Other Ambulatory Visit: Payer: BC Managed Care – PPO

## 2016-08-21 ENCOUNTER — Other Ambulatory Visit: Payer: BC Managed Care – PPO

## 2019-06-18 ENCOUNTER — Other Ambulatory Visit: Payer: Self-pay

## 2019-06-18 DIAGNOSIS — Z20822 Contact with and (suspected) exposure to covid-19: Secondary | ICD-10-CM

## 2019-06-24 ENCOUNTER — Telehealth: Payer: Self-pay | Admitting: General Practice

## 2019-06-24 LAB — NOVEL CORONAVIRUS, NAA: SARS-CoV-2, NAA: DETECTED — AB

## 2019-06-24 NOTE — Telephone Encounter (Signed)
Patient called and given covid test results as noted detected which means she was infected with the novel coronavirus, she verbalized understanding. She says her taste has come back and her smell is coming back, she's feeling much better, no respiratory symptoms. Patient advised of the COVID-19 Signs/Symptoms Tier Mild/Moderate/Severe, advised of self isolation and the Non-Testing Criteria for Ending Self-Isolation, Patient Instructions-wear a mask, hand washing, using hand sanitizer, not touching face, cough/sneeze in tissue/elbow sleeve, social distance, stay out of crowds. Patient verbalized understanding of all advice given. She asks about retesting, I advised it's not necessary unless she wants to be retested, advised of testing locations and hours, she verbalized understanding.

## 2019-06-24 NOTE — Telephone Encounter (Signed)
Pt returned call for Covid-19 test results. Pt requests call back.

## 2020-01-16 ENCOUNTER — Other Ambulatory Visit: Payer: Self-pay

## 2020-01-16 ENCOUNTER — Encounter (HOSPITAL_COMMUNITY): Payer: Self-pay

## 2020-01-16 ENCOUNTER — Ambulatory Visit (HOSPITAL_COMMUNITY)
Admission: EM | Admit: 2020-01-16 | Discharge: 2020-01-16 | Disposition: A | Discharge status: Home / Self Care | Payer: Worker's Compensation | Attending: Internal Medicine | Admitting: Internal Medicine

## 2020-01-16 DIAGNOSIS — M545 Low back pain, unspecified: Secondary | ICD-10-CM

## 2020-01-16 MED ORDER — IBUPROFEN 600 MG PO TABS: 600.0000 mg | ORAL_TABLET | Freq: Four times a day (QID) | ORAL | 0 refills | Status: AC | PRN

## 2020-01-16 MED ORDER — CYCLOBENZAPRINE HCL 5 MG PO TABS: 10.0000 mg | ORAL_TABLET | Freq: Three times a day (TID) | ORAL | 0 refills | Status: AC | PRN

## 2020-01-16 NOTE — ED Triage Notes (Signed)
Patient presents to Urgent Care with complaints of slipping on ice since walking into work this morning. Patient reports the pain has gradually become more intense in her right knee, right elbow, and lower back. Pt has not taken any meds pta.

## 2020-01-16 NOTE — ED Provider Notes (Signed)
[place]    CSN: [id number] Arrival date & time: 01/16/20  1714      History   Chief Complaint Chief Complaint  Patient presents with  . Fall    HPI Tamara May is a 29 y.o. female comes to urgent care with complaints of lower back pain, right knee pain and right elbow pain that started after she slipped on black ice this morning.  Pain in the elbow and the knee seem to be stable and has not worsened.  However, the pain in the lower back is worse.  She describes the pain as sharp, throbbing and over the lower back.  It is worse on movement and palpation.  No known relieving factors.  Patient denies any numbness, tingling or weakness in the lower extremities.  Pain does not radiate into the legs.  Patient is not aware of hitting her head during the fall.  No loss of consciousness, confusion or headaches.  HPI  Past Medical History:  Diagnosis Date  . Anemia   . B12 deficiency   . Headache(784.0)   . Hypertension   . Prediabetes   . Seizures ([place])    as a child    Patient Active Problem List   Diagnosis Date Noted  . Prediabetes   . B12 deficiency   . Anemia   . Hypertension     Past Surgical History:  Procedure Laterality Date  . COSMETIC SURGERY     Liposuction, buttock surgery  . MOUTH SURGERY    . NO PAST SURGERIES    . WISDOM TOOTH EXTRACTION      OB History    Gravida  3   Para  1   Term  1   Preterm      AB  2   Living  1     SAB  1   TAB  1   Ectopic      Multiple      Live Births  1            Home Medications    Prior to Admission medications   Medication Sig Start Date End Date Taking? Authorizing Provider  cyclobenzaprine (FLEXERIL) 5 MG tablet Take 2 tablets (10 mg total) by mouth 3 (three) times daily as needed for muscle spasms. 01/16/20   Chase Picket, MD  ibuprofen (ADVIL) 600 MG tablet Take 1 tablet (600 mg total) by mouth every 6 (six) hours as needed. 01/16/20   Makalah Asberry, Myrene Galas, MD    Family  History Family History  Problem Relation Age of Onset  . Hypertension Mother   . Cancer Mother        breast x2  . Asthma Mother   . Hypertension Father   . Gout Father   . Asthma Father   . Diabetes Father   . Diabetes Maternal Grandmother   . Vision loss Maternal Grandmother        cataracts  . Stroke Maternal Grandmother   . Diabetes Paternal Grandmother   . Alcohol abuse Paternal Grandmother   . Vision loss Maternal Uncle   . Anesthesia problems Neg Hx     Social History Social History   Tobacco Use  . Smoking status: Never Smoker  . Smokeless tobacco: Never Used  Substance Use Topics  . Alcohol use: Yes    Comment: occ  . Drug use: No     Allergies   Patient has no known allergies.   Review of Systems Review  of Systems  Constitutional: Negative for activity change, chills, fatigue and fever.  HENT: Negative.   Respiratory: Negative for cough, chest tightness and shortness of breath.   Gastrointestinal: Negative for diarrhea, nausea and vomiting.  Genitourinary: Negative.   Musculoskeletal: Positive for arthralgias, back pain and myalgias. Negative for neck pain and neck stiffness.  Skin: Negative for rash and wound.  Neurological: Negative for dizziness, weakness, light-headedness and headaches.  Psychiatric/Behavioral: Negative for confusion and decreased concentration.     Physical Exam Triage Vital Signs ED Triage Vitals  Enc Vitals Group     BP 01/16/20 1824 133/73     Pulse Rate 01/16/20 1824 65     Resp 01/16/20 1824 16     Temp 01/16/20 1824 98.4 F (36.9 C)     Temp Source 01/16/20 1824 Oral     SpO2 01/16/20 1824 100 %     Weight --      Height --      Head Circumference --      Peak Flow --      Pain Score 01/16/20 1822 8     Pain Loc --      Pain Edu? --      Excl. in GC? --    No data found.  Updated Vital Signs BP 133/73 (BP Location: Right Arm)   Pulse 65   Temp 98.4 F (36.9 C) (Oral)   Resp 16   SpO2 100%   Visual  Acuity Right Eye Distance:   Left Eye Distance:   Bilateral Distance:    Right Eye Near:   Left Eye Near:    Bilateral Near:     Physical Exam Vitals and nursing note reviewed.  Constitutional:      General: She is not in acute distress.    Appearance: She is not ill-appearing.  Cardiovascular:     Rate and Rhythm: Normal rate and regular rhythm.     Pulses: Normal pulses.  Pulmonary:     Effort: Pulmonary effort is normal.     Breath sounds: Normal breath sounds. No wheezing or rhonchi.  Abdominal:     Tenderness: There is no abdominal tenderness. There is no guarding.  Musculoskeletal:     Cervical back: Normal range of motion and neck supple. No rigidity or tenderness.     Comments: Full range of motion of the right knee and right elbow.  No swelling, deformity or bruising.  Tenderness on palpation over the paraspinal muscles in the mid to lower back.  Full range of motion with some discomfort.  No weakness in the lower extremities.  Lymphadenopathy:     Cervical: No cervical adenopathy.  Neurological:     General: No focal deficit present.     Mental Status: She is alert and oriented to person, place, and time.     Cranial Nerves: No cranial nerve deficit.     Motor: No weakness.     Gait: Gait normal.      UC Treatments / Results  Labs (all labs ordered are listed, but only abnormal results are displayed) Labs Reviewed - No data to display  EKG   Radiology No results found.  Procedures Procedures (including critical care time)  Medications Ordered in UC Medications - No data to display  Initial Impression / Assessment and Plan / UC Course  I have reviewed the triage vital signs and the nursing notes.  Pertinent labs & imaging results that were available during my care of the patient were  reviewed by me and considered in my medical decision making (see chart for details).     1.  Acute low back pain without sciatica secondary to a fall: Application  of warm compress Ibuprofen 600 mg every 6 hours as needed Flexeril as needed Gentle range of motion exercises There is no indication for x-rays at this time.  Patient has normal range of motion and mainly paraspinal muscle tenderness and tightness. If the pain is not improving after couple of days patient is welcome to return to urgent care to be evaluated. Final Clinical Impressions(s) / UC Diagnoses   Final diagnoses:  Acute low back pain without sciatica, unspecified back pain laterality   Discharge Instructions   None    ED Prescriptions    Medication Sig Dispense Auth. Provider   ibuprofen (ADVIL) 600 MG tablet Take 1 tablet (600 mg total) by mouth every 6 (six) hours as needed. 30 tablet Heide Brossart, Britta Mccreedy, MD   cyclobenzaprine (FLEXERIL) 5 MG tablet Take 2 tablets (10 mg total) by mouth 3 (three) times daily as needed for muscle spasms. 20 tablet Mc Hollen, Britta Mccreedy, MD     PDMP not reviewed this encounter.   Merrilee Jansky, MD 01/18/20 [phone]

## 2020-01-31 ENCOUNTER — Other Ambulatory Visit (HOSPITAL_COMMUNITY)
Admission: RE | Admit: 2020-01-31 | Discharge: 2020-01-31 | Disposition: A | Discharge status: Home / Self Care | Payer: BC Managed Care – PPO | Source: Ambulatory Visit | Attending: Family Medicine | Admitting: Family Medicine

## 2020-01-31 DIAGNOSIS — N898 Other specified noninflammatory disorders of vagina: Secondary | ICD-10-CM | POA: Diagnosis present

## 2020-02-02 LAB — MOLECULAR ANCILLARY ONLY???
Bacterial Vaginitis (gardnerella): POSITIVE — AB
Candida Glabrata: NEGATIVE
Chlamydia: NEGATIVE
Comment: NEGATIVE
Comment: NEGATIVE
Trichomonas: NEGATIVE

## 2020-02-02 LAB — MOLECULAR ANCILLARY ONLY
Candida Vaginitis: POSITIVE — AB
Comment: NEGATIVE
Comment: NEGATIVE
Comment: NEGATIVE
Comment: NORMAL
Neisseria Gonorrhea: POSITIVE — AB

## 2020-02-13 ENCOUNTER — Ambulatory Visit
Admission: RE | Admit: 2020-02-13 | Discharge: 2020-02-13 | Disposition: A | Discharge status: Home / Self Care | Payer: BC Managed Care – PPO | Source: Ambulatory Visit | Attending: Family Medicine | Admitting: Family Medicine

## 2020-02-13 ENCOUNTER — Other Ambulatory Visit: Payer: Self-pay | Admitting: Family Medicine

## 2020-02-13 DIAGNOSIS — W19XXXA Unspecified fall, initial encounter: Secondary | ICD-10-CM

## 2020-03-05 ENCOUNTER — Other Ambulatory Visit (HOSPITAL_COMMUNITY)
Admission: RE | Admit: 2020-03-05 | Discharge: 2020-03-05 | Disposition: A | Discharge status: Home / Self Care | Payer: BC Managed Care – PPO | Source: Ambulatory Visit | Attending: Family Medicine | Admitting: Family Medicine

## 2020-03-05 ENCOUNTER — Other Ambulatory Visit: Payer: Self-pay | Admitting: Family Medicine

## 2020-03-05 DIAGNOSIS — N898 Other specified noninflammatory disorders of vagina: Secondary | ICD-10-CM | POA: Insufficient documentation

## 2020-03-06 LAB — MOLECULAR ANCILLARY ONLY
Candida Glabrata: NEGATIVE
Candida Vaginitis: NEGATIVE
Comment: NEGATIVE
Comment: NEGATIVE
Comment: NEGATIVE
Comment: NEGATIVE
Comment: NORMAL
Neisseria Gonorrhea: NEGATIVE
Trichomonas: NEGATIVE

## 2020-03-06 LAB — MOLECULAR ANCILLARY ONLY???
Bacterial Vaginitis (gardnerella): NEGATIVE
Chlamydia: NEGATIVE
Comment: NEGATIVE

## 2020-07-03 ENCOUNTER — Other Ambulatory Visit (HOSPITAL_COMMUNITY)
Admission: RE | Admit: 2020-07-03 | Discharge: 2020-07-03 | Disposition: A | Discharge status: Home / Self Care | Payer: BC Managed Care – PPO | Source: Ambulatory Visit | Attending: Family Medicine | Admitting: Family Medicine

## 2020-07-03 DIAGNOSIS — Z124 Encounter for screening for malignant neoplasm of cervix: Secondary | ICD-10-CM | POA: Diagnosis not present

## 2020-07-05 ENCOUNTER — Telehealth (HOSPITAL_COMMUNITY): Payer: Self-pay

## 2020-07-05 LAB — MOLECULAR ANCILLARY ONLY
Bacterial Vaginitis (gardnerella): POSITIVE — AB
Candida Glabrata: NEGATIVE
Candida Vaginitis: NEGATIVE
Chlamydia: NEGATIVE
Comment: NEGATIVE
Comment: NEGATIVE
Comment: NEGATIVE
Comment: NEGATIVE
Comment: NEGATIVE
Comment: NORMAL
Neisseria Gonorrhea: NEGATIVE
Trichomonas: NEGATIVE

## 2020-07-05 MED ORDER — METRONIDAZOLE 500 MG PO TABS: 500.0000 mg | ORAL_TABLET | Freq: Two times a day (BID) | ORAL | 0 refills | Status: AC

## 2020-07-05 NOTE — Telephone Encounter (Signed)
Bacterial vaginosis is positive. Pt needs treatment. Flagyl 500 mg BID x 7 days #14 no refills sent to patients pharmacy of choice.   

## 2020-07-12 LAB — CYTOLOGY - PAP
Comment: NEGATIVE
Comment: NEGATIVE
Diagnosis: HIGH — AB
HPV 16: NEGATIVE
HPV 18 / 45: NEGATIVE
HSV1: NEGATIVE
HSV2: NEGATIVE
High risk HPV: POSITIVE — AB

## 2021-10-06 IMAGING — CR DG SHOULDER 2+V*R*
3 series · 3 of 3 positions shown · non-contrast
Comparison: None.

CLINICAL DATA: Fell on ice

EXAM:
RIGHT SHOULDER - 2+ VIEW

[w shoulder ap internal righ *]
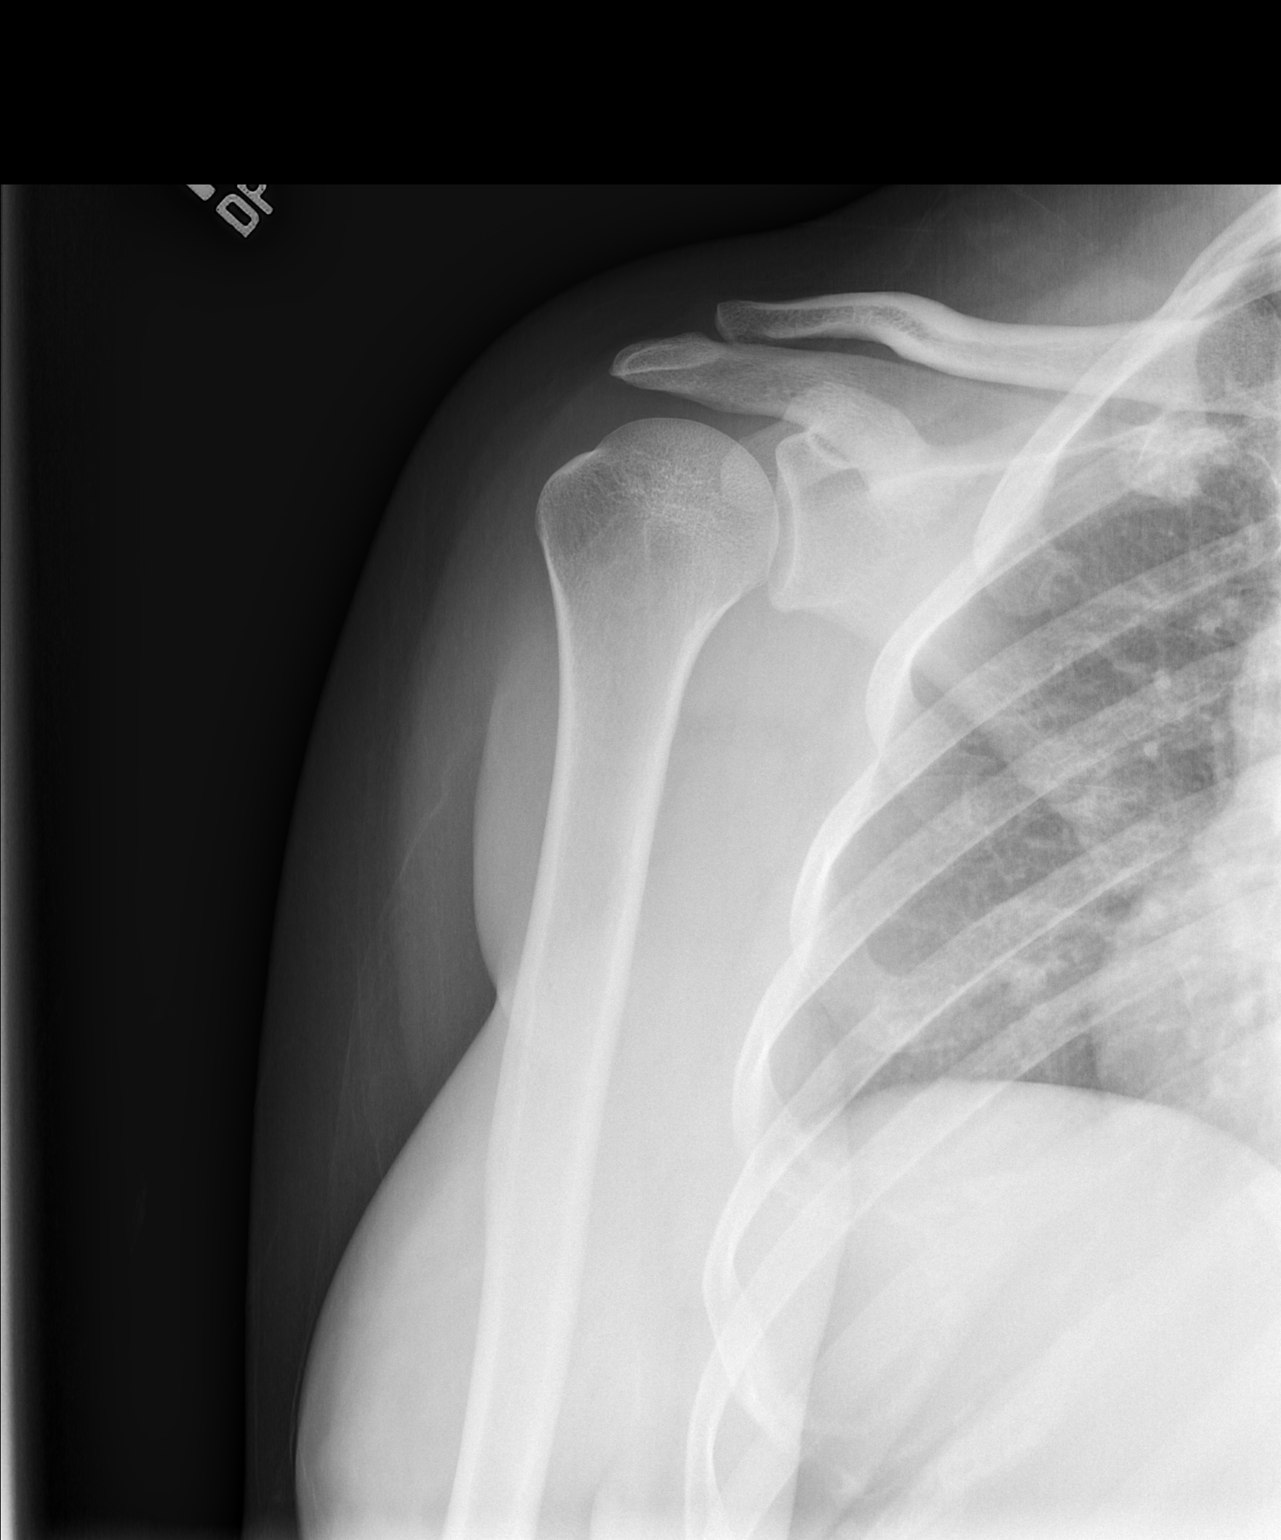

[w shoulder y view right *]
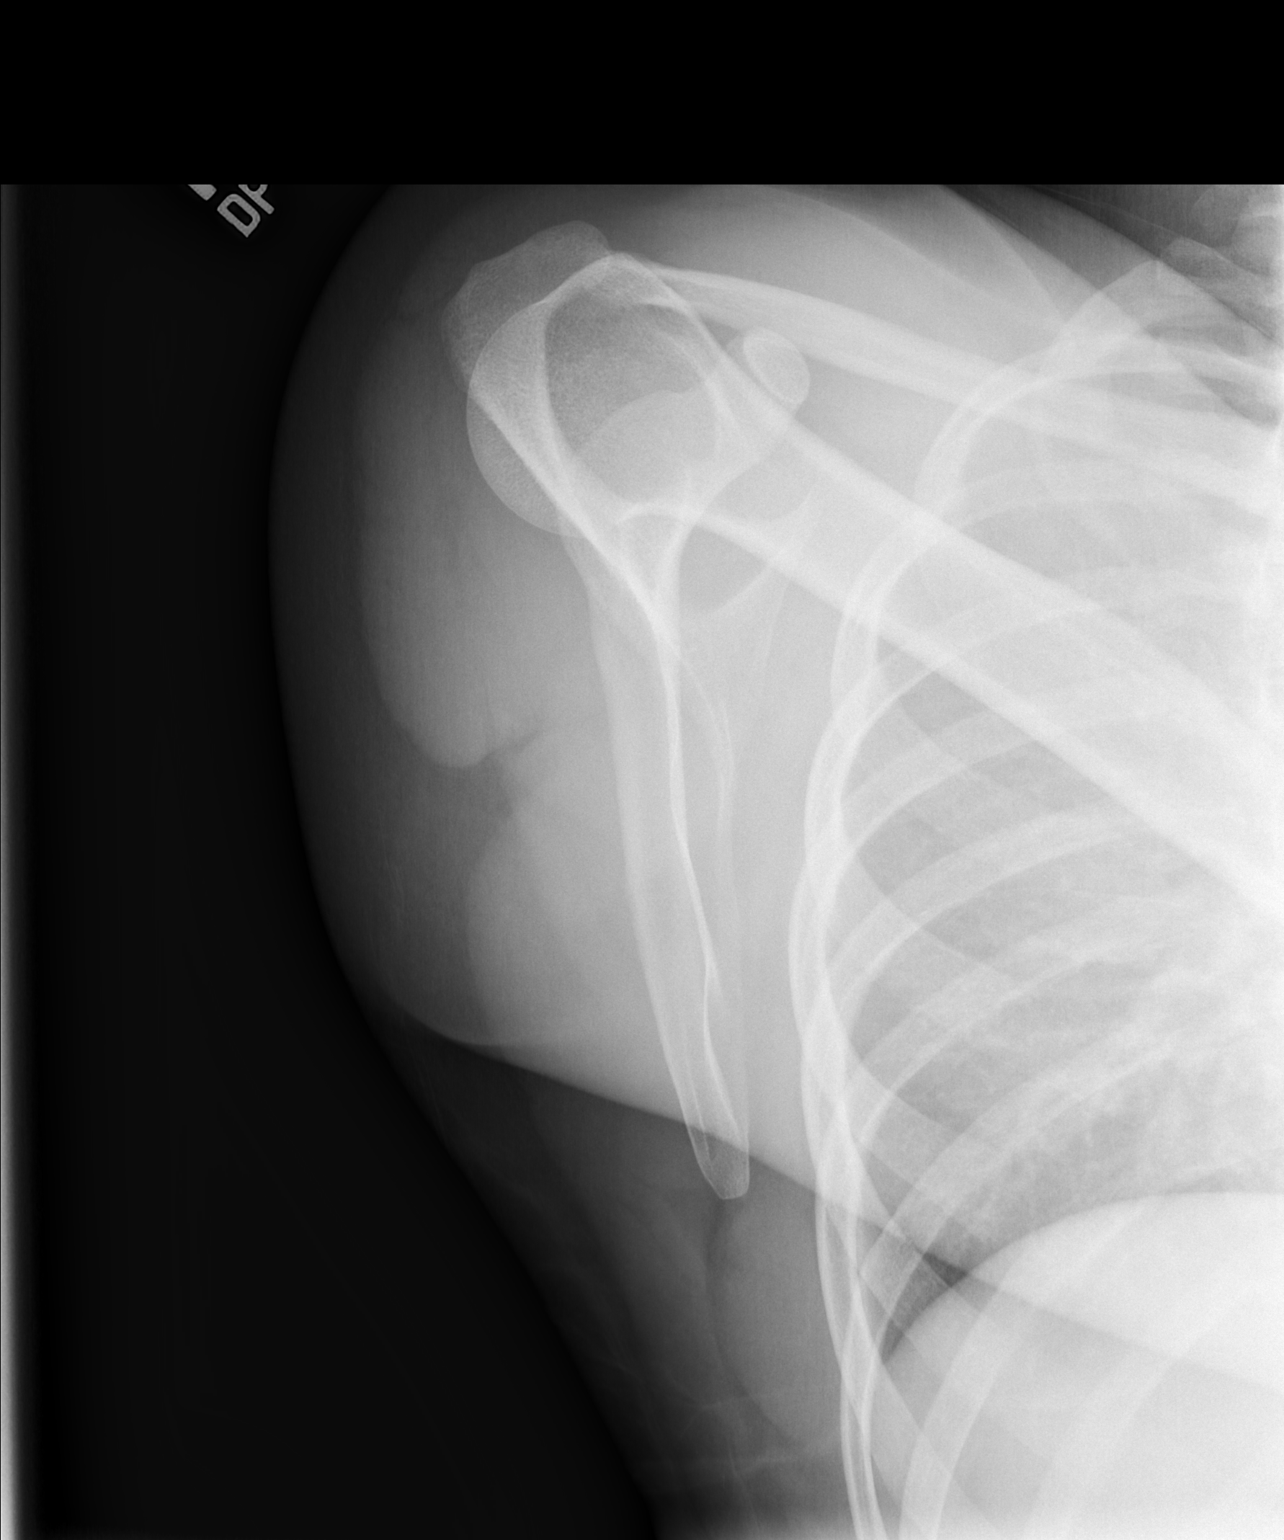

[w shoulder axillary right *]
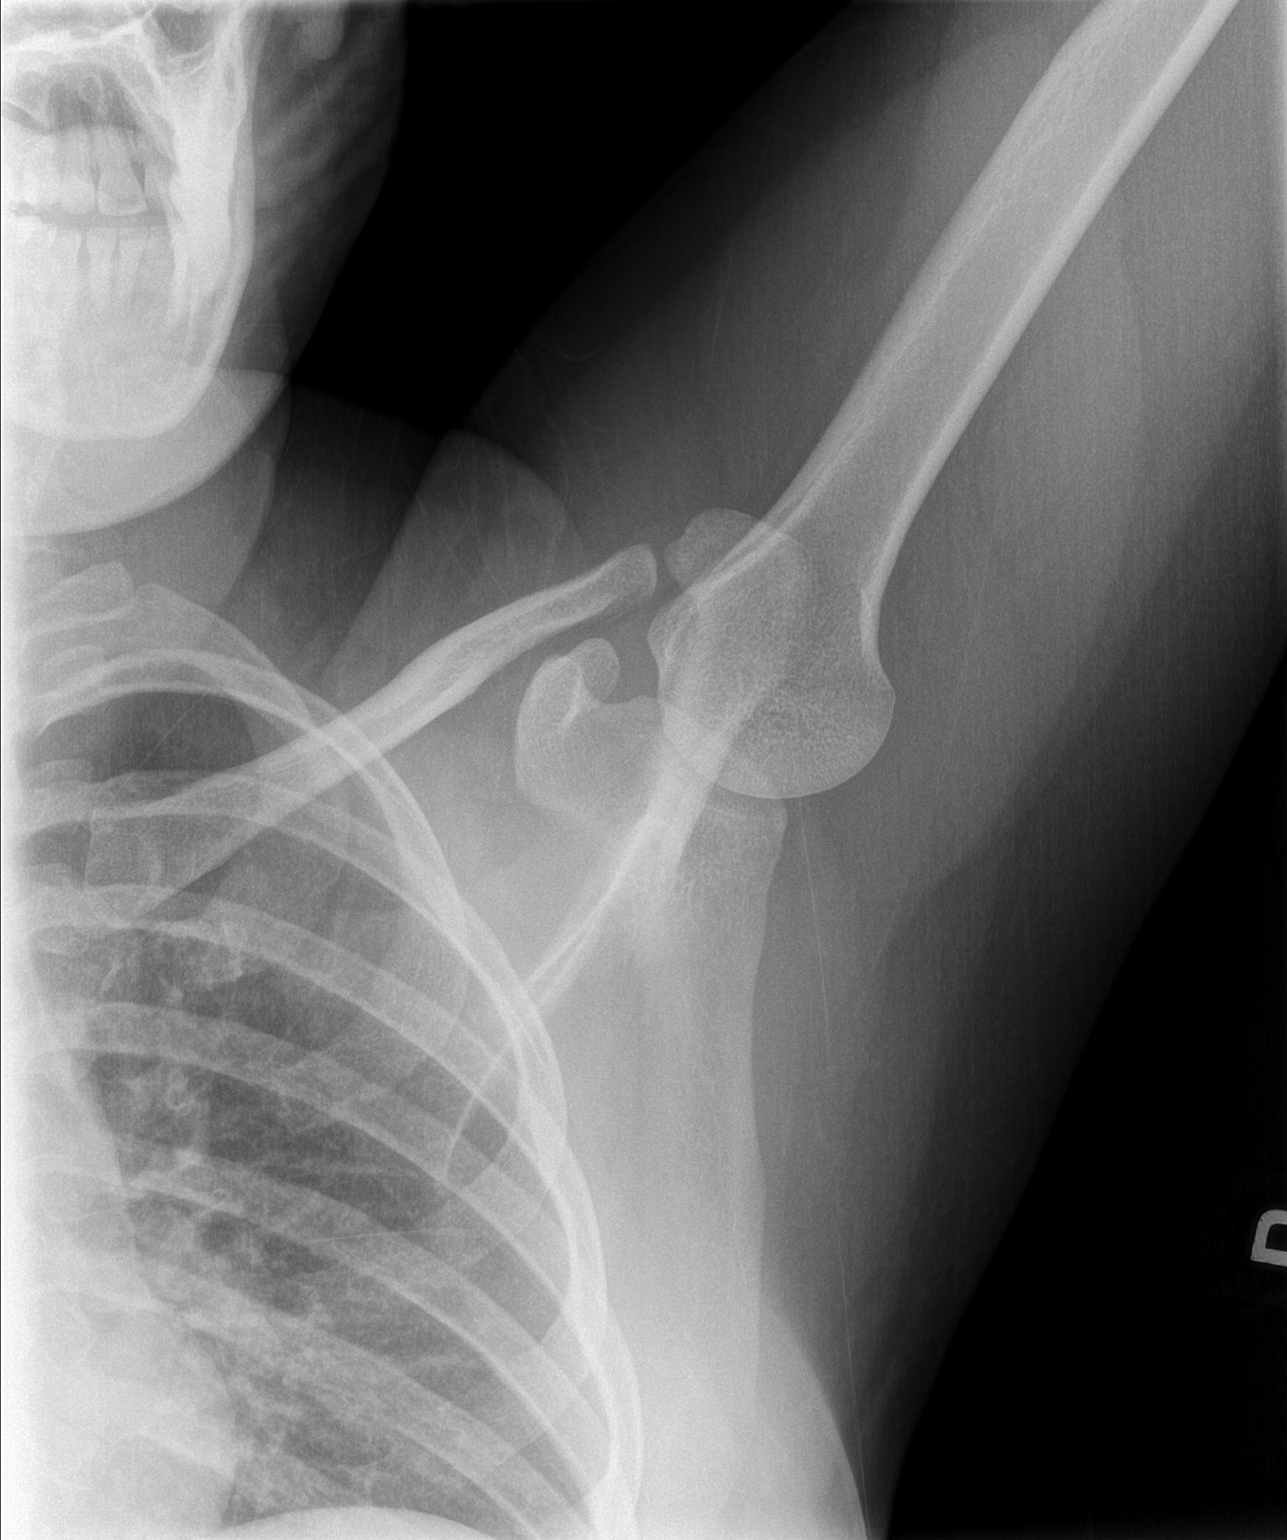

[3 of 3 positions shown; findings below may reference images not displayed]

FINDINGS: There is no evidence of fracture or dislocation. There is no
evidence of arthropathy or other focal bone abnormality. Soft
tissues are unremarkable.
IMPRESSION: Negative.
